# Patient Record
Sex: Male | Born: 1965 | Race: Black or African American | Hispanic: No | Marital: Married | Smoking: Never smoker
Health system: Southern US, Community
[De-identification: ages and names within clinical notes are randomized; demographics above are authoritative.]

## PROBLEM LIST (undated history)

## (undated) DIAGNOSIS — K760 Fatty (change of) liver, not elsewhere classified: Secondary | ICD-10-CM

## (undated) DIAGNOSIS — E669 Obesity, unspecified: Secondary | ICD-10-CM

## (undated) DIAGNOSIS — J039 Acute tonsillitis, unspecified: Secondary | ICD-10-CM

## (undated) DIAGNOSIS — I10 Essential (primary) hypertension: Secondary | ICD-10-CM

## (undated) DIAGNOSIS — Z7982 Long term (current) use of aspirin: Secondary | ICD-10-CM

## (undated) DIAGNOSIS — R079 Chest pain, unspecified: Secondary | ICD-10-CM

## (undated) DIAGNOSIS — I2510 Atherosclerotic heart disease of native coronary artery without angina pectoris: Secondary | ICD-10-CM

## (undated) DIAGNOSIS — R001 Bradycardia, unspecified: Secondary | ICD-10-CM

## (undated) DIAGNOSIS — G894 Chronic pain syndrome: Secondary | ICD-10-CM

## (undated) DIAGNOSIS — G4733 Obstructive sleep apnea (adult) (pediatric): Secondary | ICD-10-CM

## (undated) DIAGNOSIS — R42 Dizziness and giddiness: Secondary | ICD-10-CM

## (undated) DIAGNOSIS — M5412 Radiculopathy, cervical region: Secondary | ICD-10-CM

## (undated) DIAGNOSIS — R7303 Prediabetes: Secondary | ICD-10-CM

## (undated) DIAGNOSIS — E7800 Pure hypercholesterolemia, unspecified: Secondary | ICD-10-CM

## (undated) DIAGNOSIS — G5601 Carpal tunnel syndrome, right upper limb: Secondary | ICD-10-CM

## (undated) DIAGNOSIS — M5030 Other cervical disc degeneration, unspecified cervical region: Secondary | ICD-10-CM

## (undated) DIAGNOSIS — N62 Hypertrophy of breast: Secondary | ICD-10-CM

## (undated) DIAGNOSIS — K219 Gastro-esophageal reflux disease without esophagitis: Secondary | ICD-10-CM

## (undated) HISTORY — DX: Gastro-esophageal reflux disease without esophagitis: K21.9

## (undated) HISTORY — PX: CERVICAL SPINE SURGERY: SHX589

## (undated) HISTORY — DX: Pure hypercholesterolemia, unspecified: E78.00

## (undated) HISTORY — PX: ELBOW SURGERY: SHX618

## (undated) HISTORY — DX: Fatty (change of) liver, not elsewhere classified: K76.0

## (undated) HISTORY — PX: COLONOSCOPY: SHX174

## (undated) HISTORY — DX: Acute tonsillitis, unspecified: J03.90

---

## 2010-10-08 ENCOUNTER — Emergency Department: Payer: Self-pay | Admitting: Emergency Medicine

## 2011-09-16 ENCOUNTER — Emergency Department: Payer: Self-pay | Admitting: Emergency Medicine

## 2011-10-01 DEATH — deceased

## 2011-12-09 ENCOUNTER — Ambulatory Visit: Payer: Self-pay | Admitting: Physician Assistant

## 2012-10-19 ENCOUNTER — Ambulatory Visit: Payer: Self-pay | Admitting: Neurosurgery

## 2013-01-27 ENCOUNTER — Ambulatory Visit: Payer: Self-pay | Admitting: Anesthesiology

## 2013-02-02 ENCOUNTER — Encounter: Payer: Self-pay | Admitting: Neurology

## 2013-02-28 ENCOUNTER — Encounter: Payer: Self-pay | Admitting: Neurology

## 2013-03-24 ENCOUNTER — Ambulatory Visit: Payer: Self-pay | Admitting: Anesthesiology

## 2013-03-30 ENCOUNTER — Encounter: Payer: Self-pay | Admitting: Neurology

## 2013-04-30 ENCOUNTER — Encounter: Payer: Self-pay | Admitting: Neurology

## 2013-05-06 ENCOUNTER — Ambulatory Visit: Payer: Self-pay | Admitting: Neurology

## 2013-05-10 ENCOUNTER — Ambulatory Visit: Payer: Self-pay | Admitting: Anesthesiology

## 2014-03-03 DIAGNOSIS — G8929 Other chronic pain: Secondary | ICD-10-CM | POA: Insufficient documentation

## 2014-03-03 DIAGNOSIS — M542 Cervicalgia: Secondary | ICD-10-CM

## 2014-04-06 ENCOUNTER — Ambulatory Visit: Payer: Self-pay | Admitting: Anesthesiology

## 2014-10-27 ENCOUNTER — Ambulatory Visit: Payer: Self-pay | Admitting: Anesthesiology
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
## 2015-04-26 ENCOUNTER — Other Ambulatory Visit: Payer: Self-pay | Admitting: Anesthesiology

## 2015-05-01 ENCOUNTER — Telehealth: Payer: Self-pay | Admitting: Anesthesiology

## 2015-05-01 NOTE — Telephone Encounter (Signed)
patient has not been seen since Jan 2016. Has appt Wednesday 05-03-2015 for refill.

## 2015-05-01 NOTE — Telephone Encounter (Signed)
Needs to get a script for tramadol

## 2015-05-03 ENCOUNTER — Encounter: Payer: Self-pay | Admitting: Anesthesiology

## 2015-05-04 ENCOUNTER — Encounter: Payer: Self-pay | Admitting: Anesthesiology

## 2015-05-04 ENCOUNTER — Ambulatory Visit: Payer: Federal, State, Local not specified - PPO | Attending: Anesthesiology | Admitting: Anesthesiology

## 2015-05-04 VITALS — BP 134/78 | HR 77 | Temp 98.6°F | Resp 18 | Ht 68.0 in | Wt 225.0 lb

## 2015-05-04 DIAGNOSIS — G8929 Other chronic pain: Secondary | ICD-10-CM | POA: Insufficient documentation

## 2015-05-04 DIAGNOSIS — M47812 Spondylosis without myelopathy or radiculopathy, cervical region: Secondary | ICD-10-CM

## 2015-05-04 DIAGNOSIS — M542 Cervicalgia: Secondary | ICD-10-CM | POA: Insufficient documentation

## 2015-05-04 DIAGNOSIS — M5136 Other intervertebral disc degeneration, lumbar region: Secondary | ICD-10-CM

## 2015-05-04 MED ORDER — TRAMADOL HCL 50 MG PO TABS: 50.0000 mg | ORAL_TABLET | Freq: Four times a day (QID) | ORAL | Status: DC | PRN

## 2015-05-04 NOTE — Progress Notes (Signed)
Safety precautions to be maintained throughout the outpatient stay will include: orient to surroundings, keep bed in low position, maintain call bell within reach at all times, provide assistance with transfer out of bed and ambulation.  

## 2015-05-04 NOTE — Progress Notes (Signed)
Discharged to home, ambulatory with script for tramadol in hand.  Return in 6 months.

## 2015-05-09 NOTE — Progress Notes (Addendum)
Chief complaint is cervicalgia    Procedure: none  History of present illness: Carlos Gentry continues to do reasonably well with the current medication regimen. The pain continues to stay reasonably well controlled with no  significant changes noted in baseline symptom complex. No change upper  extremity strength or function or bowel bladder function. Based on the  narcotic assessment sheet, the  patient continues to do well with this current regimen with no evidence of diverting or illicit use and better overall functioning noted. He continues to take his flexeril prn, nubametone and ultram, generally two in the am and sometimes a third in the am.  I instructed him to hold on the third until 6 hours the intial am dosing.  BP 134/78 mmHg  Pulse 77  Temp(Src) 98.6 F (37 C) (Oral)  Resp 18  Ht  (1.727 m)  Wt 225 lb (102.059 kg)  BMI 34.22 kg/m2  SpO2 100%   Current outpatient prescriptions:  .  cyclobenzaprine (FLEXERIL) 10 MG tablet, Take 10 mg by mouth. prn, Disp: , Rfl:  .  fluticasone (FLONASE) 50 MCG/ACT nasal spray, Place into both nostrils daily. prn, Disp: , Rfl:  .  ibuprofen (ADVIL,MOTRIN) 200 MG tablet, Take 200 mg by mouth. 2 tabs as needed, Disp: , Rfl:  .  nabumetone (RELAFEN) 500 MG tablet, Take 500 mg by mouth 2 (two) times daily., Disp: , Rfl:  .  omeprazole (PRILOSEC) 40 MG capsule, Take 40 mg by mouth 3 (three) times daily., Disp: , Rfl:  .  pravastatin (PRAVACHOL) 10 MG tablet, Take 10 mg by mouth daily., Disp: , Rfl:  .  traMADol (ULTRAM) 50 MG tablet, Take 1 tablet (50 mg total) by mouth every 6 (six) hours as needed for moderate pain., Disp: 120 tablet, Rfl: 5  There are no active problems to display for this patient.   No Known Allergies  Physical exam:   pupils are equally round and reactive to light  Extraocular muscles are intact   Heart is regular rate and rhythm  Upper extremity strength and function remains at baseline with no significant  changes noted on examination today.  Assessment:  #1 chronic neck pain, cervicalgia status post acdf   #2 chronic pain management  Plan:   We'll refill medications at present with return to clinic in the next several months for reevaluation. Patient is to continue with physical therapy exercises and aerobic conditioning as tolerated and continue follow-up with their primary care physician for baseline medical problems. He is to call if his sx change or worsen Dr. Gwenyth Bender 7:21 PM

## 2015-12-20 ENCOUNTER — Other Ambulatory Visit: Payer: Self-pay | Admitting: Physician Assistant

## 2015-12-20 DIAGNOSIS — R1084 Generalized abdominal pain: Secondary | ICD-10-CM

## 2015-12-21 ENCOUNTER — Ambulatory Visit (HOSPITAL_BASED_OUTPATIENT_CLINIC_OR_DEPARTMENT_OTHER): Payer: Federal, State, Local not specified - PPO | Admitting: Anesthesiology

## 2015-12-21 ENCOUNTER — Encounter: Payer: Self-pay | Admitting: Anesthesiology

## 2015-12-21 VITALS — BP 130/76 | HR 71 | Temp 98.1°F | Resp 16 | Ht 68.0 in | Wt 230.0 lb

## 2015-12-21 DIAGNOSIS — M4692 Unspecified inflammatory spondylopathy, cervical region: Secondary | ICD-10-CM | POA: Diagnosis not present

## 2015-12-21 DIAGNOSIS — R1084 Generalized abdominal pain: Secondary | ICD-10-CM | POA: Diagnosis present

## 2015-12-21 DIAGNOSIS — M542 Cervicalgia: Secondary | ICD-10-CM

## 2015-12-21 DIAGNOSIS — M5136 Other intervertebral disc degeneration, lumbar region: Secondary | ICD-10-CM

## 2015-12-21 DIAGNOSIS — N281 Cyst of kidney, acquired: Secondary | ICD-10-CM | POA: Diagnosis not present

## 2015-12-21 DIAGNOSIS — Z981 Arthrodesis status: Secondary | ICD-10-CM | POA: Diagnosis not present

## 2015-12-21 DIAGNOSIS — Z9889 Other specified postprocedural states: Secondary | ICD-10-CM | POA: Diagnosis not present

## 2015-12-21 DIAGNOSIS — G8929 Other chronic pain: Secondary | ICD-10-CM | POA: Diagnosis not present

## 2015-12-21 DIAGNOSIS — Z7951 Long term (current) use of inhaled steroids: Secondary | ICD-10-CM | POA: Diagnosis not present

## 2015-12-21 DIAGNOSIS — M47812 Spondylosis without myelopathy or radiculopathy, cervical region: Secondary | ICD-10-CM

## 2015-12-21 DIAGNOSIS — N1330 Unspecified hydronephrosis: Secondary | ICD-10-CM | POA: Diagnosis not present

## 2015-12-21 DIAGNOSIS — K760 Fatty (change of) liver, not elsewhere classified: Secondary | ICD-10-CM | POA: Diagnosis not present

## 2015-12-21 MED ORDER — TRAMADOL HCL 50 MG PO TABS: 50.0000 mg | ORAL_TABLET | Freq: Four times a day (QID) | ORAL | Status: DC | PRN

## 2015-12-21 MED ORDER — NABUMETONE 500 MG PO TABS: 500.0000 mg | ORAL_TABLET | Freq: Two times a day (BID) | ORAL | Status: DC

## 2015-12-21 MED ORDER — CYCLOBENZAPRINE HCL 10 MG PO TABS: 10.0000 mg | ORAL_TABLET | Freq: Once | ORAL | Status: DC

## 2015-12-21 NOTE — Patient Instructions (Signed)
Prescriptions for Relafen and Flexeril were sent to your pharmacy. You were given a prescription for Tramadol today.

## 2015-12-21 NOTE — Progress Notes (Signed)
Safety precautions to be maintained throughout the outpatient stay will include: orient to surroundings, keep bed in low position, maintain call bell within reach at all times, provide assistance with transfer out of bed and ambulation.  

## 2015-12-22 ENCOUNTER — Ambulatory Visit
Admission: RE | Admit: 2015-12-22 | Discharge: 2015-12-22 | Disposition: A | Discharge status: Home / Self Care | Payer: Federal, State, Local not specified - PPO | Source: Ambulatory Visit | Attending: Physician Assistant | Admitting: Physician Assistant

## 2015-12-22 DIAGNOSIS — Z981 Arthrodesis status: Secondary | ICD-10-CM | POA: Insufficient documentation

## 2015-12-22 DIAGNOSIS — N1330 Unspecified hydronephrosis: Secondary | ICD-10-CM | POA: Insufficient documentation

## 2015-12-22 DIAGNOSIS — G8929 Other chronic pain: Secondary | ICD-10-CM | POA: Insufficient documentation

## 2015-12-22 DIAGNOSIS — K760 Fatty (change of) liver, not elsewhere classified: Secondary | ICD-10-CM | POA: Insufficient documentation

## 2015-12-22 DIAGNOSIS — N281 Cyst of kidney, acquired: Secondary | ICD-10-CM | POA: Insufficient documentation

## 2015-12-22 DIAGNOSIS — R1084 Generalized abdominal pain: Secondary | ICD-10-CM | POA: Diagnosis not present

## 2015-12-22 DIAGNOSIS — Z7951 Long term (current) use of inhaled steroids: Secondary | ICD-10-CM | POA: Insufficient documentation

## 2015-12-22 DIAGNOSIS — Z9889 Other specified postprocedural states: Secondary | ICD-10-CM | POA: Insufficient documentation

## 2015-12-22 DIAGNOSIS — M542 Cervicalgia: Secondary | ICD-10-CM | POA: Insufficient documentation

## 2015-12-22 NOTE — Progress Notes (Signed)
Chief complaint is cervicalgia    Procedure: none  History of present illness: Carlos Gentry was last seen approximately 6 months ago. We see him intermittently for his neck pain following surgery. He's been taking his medications in the usual fashion as prescribed and using Flexeril on a when necessary basis.  He takes Ultram approximately twice a day for breakthrough pain and uses Relafen when necessary as well. Otherwise his neck pain and upper extremity strength and function are at baseline and based on his narcotic assessment sheet seems to be doing well with his current regimen.  BP 130/76 mmHg  Pulse 71  Temp(Src) 98.1 F (36.7 C) (Oral)  Resp 16  Ht 5\' 8"  (1.727 m)  Wt 230 lb (104.327 kg)  BMI 34.98 kg/m2  SpO2 98%   Current outpatient prescriptions:  .  cyclobenzaprine (FLEXERIL) 10 MG tablet, Take 1 tablet (10 mg total) by mouth once. prn, Disp: 30 tablet, Rfl: 5 .  fluticasone (FLONASE) 50 MCG/ACT nasal spray, Place into both nostrils daily. prn, Disp: , Rfl:  .  ibuprofen (ADVIL,MOTRIN) 200 MG tablet, Take 200 mg by mouth. 2 tabs as needed, Disp: , Rfl:  .  meloxicam (MOBIC) 15 MG tablet, Take 15 mg by mouth daily., Disp: , Rfl:  .  nabumetone (RELAFEN) 500 MG tablet, Take 1 tablet (500 mg total) by mouth 2 (two) times daily., Disp: 60 tablet, Rfl: 5 .  omeprazole (PRILOSEC) 40 MG capsule, Take 40 mg by mouth 3 (three) times daily., Disp: , Rfl:  .  pravastatin (PRAVACHOL) 10 MG tablet, Take 10 mg by mouth daily., Disp: , Rfl:  .  traMADol (ULTRAM) 50 MG tablet, Take 1 tablet (50 mg total) by mouth every 6 (six) hours as needed for moderate pain., Disp: 120 tablet, Rfl: 5  There are no active problems to display for this patient.   No Known Allergies  Physical exam:   pupils are equally round and reactive to light  Extraocular muscles are intact   Heart is regular rate and rhythm  Upper extremity strength and function remains at baseline with no significant changes  noted on examination today.  Assessment:  #1 chronic neck pain, cervicalgia status post acdf   #2 chronic pain management  Plan:   We'll refill medications at present with return to clinic in the next several months for reevaluation. Patient is to continue with physical therapy exercises and aerobic conditioning as tolerated and continue follow-up with their primary care physician for baseline medical problems. He is to call if his sx change or worsen Dr. Gwenyth BenderJames Sonu Kruckenberg 10:34 AM

## 2016-01-04 DIAGNOSIS — R7303 Prediabetes: Secondary | ICD-10-CM | POA: Insufficient documentation

## 2016-01-04 DIAGNOSIS — Z7189 Other specified counseling: Secondary | ICD-10-CM | POA: Insufficient documentation

## 2016-01-04 DIAGNOSIS — Z7185 Encounter for immunization safety counseling: Secondary | ICD-10-CM | POA: Insufficient documentation

## 2016-01-17 ENCOUNTER — Other Ambulatory Visit: Payer: Self-pay | Admitting: Physician Assistant

## 2016-01-17 DIAGNOSIS — K760 Fatty (change of) liver, not elsewhere classified: Secondary | ICD-10-CM

## 2016-01-23 ENCOUNTER — Ambulatory Visit: Payer: Federal, State, Local not specified - PPO

## 2016-01-25 ENCOUNTER — Ambulatory Visit
Admission: RE | Admit: 2016-01-25 | Discharge: 2016-01-25 | Disposition: A | Discharge status: Home / Self Care | Payer: Federal, State, Local not specified - PPO | Source: Ambulatory Visit | Attending: Physician Assistant | Admitting: Physician Assistant

## 2016-01-25 ENCOUNTER — Ambulatory Visit: Payer: Federal, State, Local not specified - PPO

## 2016-01-25 DIAGNOSIS — K760 Fatty (change of) liver, not elsewhere classified: Secondary | ICD-10-CM | POA: Diagnosis not present

## 2016-02-29 ENCOUNTER — Ambulatory Visit: Payer: Federal, State, Local not specified - PPO | Admitting: Dietician

## 2016-03-21 ENCOUNTER — Encounter: Payer: Self-pay | Admitting: Dietician

## 2016-03-21 ENCOUNTER — Encounter: Payer: Federal, State, Local not specified - PPO | Attending: Physician Assistant | Admitting: Dietician

## 2016-03-21 VITALS — Ht 68.0 in | Wt 224.8 lb

## 2016-03-21 DIAGNOSIS — K760 Fatty (change of) liver, not elsewhere classified: Secondary | ICD-10-CM | POA: Diagnosis not present

## 2016-03-21 DIAGNOSIS — E669 Obesity, unspecified: Secondary | ICD-10-CM

## 2016-03-21 DIAGNOSIS — E7800 Pure hypercholesterolemia, unspecified: Secondary | ICD-10-CM | POA: Insufficient documentation

## 2016-03-21 DIAGNOSIS — I10 Essential (primary) hypertension: Secondary | ICD-10-CM | POA: Insufficient documentation
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
## 2016-03-21 NOTE — Patient Instructions (Addendum)
   Control portions of meats and starchy foods, add more vegetables.   Make veggies convenient, try pre-cut, washed veggies, keep them in sight in the fridge.   Also control portions of sweets like cookies, try graham crackers or vanilla wafers with a small amount of peanut butter, or nutella for chocolate taste. Or eat a fruit followed by a smaller portion of cookies.   Try tracking food intake and exercise using a free app like MyFitnessPal or LoseIt.   Walk more during shift at work, maybe with a step counter.

## 2016-05-06 ENCOUNTER — Ambulatory Visit: Payer: Federal, State, Local not specified - PPO | Admitting: Dietician

## 2016-05-23 ENCOUNTER — Encounter: Payer: Self-pay | Admitting: Dietician

## 2016-05-23 NOTE — Progress Notes (Signed)
Have not heard from patient after missing two consecutive appointments for RD follow-up. Sent discharge letter to referring PA.

## 2016-08-28 ENCOUNTER — Ambulatory Visit: Payer: Federal, State, Local not specified - PPO | Attending: Anesthesiology | Admitting: Anesthesiology

## 2016-08-28 VITALS — BP 131/82 | HR 78 | Temp 98.2°F | Resp 14 | Ht 68.0 in | Wt 228.0 lb

## 2016-08-28 DIAGNOSIS — Z981 Arthrodesis status: Secondary | ICD-10-CM | POA: Insufficient documentation

## 2016-08-28 DIAGNOSIS — M542 Cervicalgia: Secondary | ICD-10-CM | POA: Insufficient documentation

## 2016-08-28 DIAGNOSIS — R200 Anesthesia of skin: Secondary | ICD-10-CM | POA: Insufficient documentation

## 2016-08-28 DIAGNOSIS — M4692 Unspecified inflammatory spondylopathy, cervical region: Secondary | ICD-10-CM | POA: Diagnosis not present

## 2016-08-28 DIAGNOSIS — Z5189 Encounter for other specified aftercare: Secondary | ICD-10-CM | POA: Diagnosis present

## 2016-08-28 DIAGNOSIS — M5136 Other intervertebral disc degeneration, lumbar region: Secondary | ICD-10-CM

## 2016-08-28 DIAGNOSIS — I10 Essential (primary) hypertension: Secondary | ICD-10-CM | POA: Insufficient documentation

## 2016-08-28 DIAGNOSIS — M47812 Spondylosis without myelopathy or radiculopathy, cervical region: Secondary | ICD-10-CM

## 2016-08-28 DIAGNOSIS — G8929 Other chronic pain: Secondary | ICD-10-CM | POA: Diagnosis not present

## 2016-08-28 MED ORDER — TRAMADOL HCL 50 MG PO TABS: 50.0000 mg | ORAL_TABLET | Freq: Four times a day (QID) | ORAL | 2 refills | Status: DC | PRN

## 2016-08-28 NOTE — Progress Notes (Signed)
Safety precautions to be maintained throughout the outpatient stay will include: orient to surroundings, keep bed in low position, maintain call bell within reach at all times, provide assistance with transfer out of bed and ambulation.  

## 2016-08-29 ENCOUNTER — Other Ambulatory Visit: Payer: Self-pay

## 2016-09-02 NOTE — Progress Notes (Signed)
Chief complaint is cervicalgia    Procedure: none   History of present illness: Carlos Gentry presents for reevaluation. He has been doing well since his last visit several months ago. He has had some intermittent neck pain with some numbness and tingling in the fingers on the left hand. He describes this to be in the fourth and fifth digits and is intermittent in nature and worse with certain increase in activity but no weakness is noted he is doing exercises with stretching and strengthening as prescribed and otherwise appears to be doing well. No interval changes are noted.  Review of systems Cardiac: No angina or chest pain GI: No constipation  BP 131/82   Pulse 78   Temp 98.2 F (36.8 C) (Oral)   Resp 14   Ht 5\' 8"  (1.727 m)   Wt 228 lb (103.4 kg)   SpO2 100%   BMI 34.67 kg/m    Current Outpatient Prescriptions:  .  cyclobenzaprine (FLEXERIL) 10 MG tablet, Take 1 tablet (10 mg total) by mouth once. prn, Disp: 30 tablet, Rfl: 5 .  fluticasone (FLONASE) 50 MCG/ACT nasal spray, Place into both nostrils daily. prn, Disp: , Rfl:  .  ibuprofen (ADVIL,MOTRIN) 200 MG tablet, Take 200 mg by mouth. 2 tabs as needed, Disp: , Rfl:  .  meloxicam (MOBIC) 15 MG tablet, Take 15 mg by mouth daily., Disp: , Rfl:  .  nabumetone (RELAFEN) 500 MG tablet, Take 1 tablet (500 mg total) by mouth 2 (two) times daily., Disp: 60 tablet, Rfl: 5 .  omeprazole (PRILOSEC) 40 MG capsule, Take 40 mg by mouth 3 (three) times daily., Disp: , Rfl:  .  pravastatin (PRAVACHOL) 10 MG tablet, Take 10 mg by mouth daily., Disp: , Rfl:  .  traMADol (ULTRAM) 50 MG tablet, Take 1 tablet (50 mg total) by mouth every 6 (six) hours as needed for moderate pain., Disp: 120 tablet, Rfl: 2  Patient Active Problem List   Diagnosis Date Noted  . Benign essential HTN 03/21/2016  . Adiposity 03/21/2016  . Pure hypercholesterolemia 03/21/2016  . Borderline diabetes mellitus 01/04/2016  . Other specified counseling 01/04/2016  .  Chronic cervical pain 03/03/2014    No Known Allergies  Physical exam:   pupils are equally round and reactive to light  Extraocular muscles are intact   Heart is regular rate and rhythm  His upper extremity muscle tone and bulk is excellent without deficit noted. No numbness or tingling are noted in the left upper extremity.  Assessment:  #1 chronic neck pain, cervicalgia status post acdf   #2 chronic pain management.  Plan:  We will have him continue taking his current medication regimen. He can continue taking anti-inflammatories as prescribed in addition to his Ultram. He is to continue doing his upper extremity exercises as reviewed with him in detail today.   Dr. Gwenyth BenderJames Adams 9:49 AM

## 2016-09-04 LAB — TOXASSURE SELECT 13 (MW), URINE

## 2017-01-14 ENCOUNTER — Ambulatory Visit: Payer: Federal, State, Local not specified - PPO | Admitting: Anesthesiology

## 2017-01-27 ENCOUNTER — Encounter: Payer: Self-pay | Admitting: Anesthesiology

## 2017-01-27 ENCOUNTER — Ambulatory Visit: Payer: Federal, State, Local not specified - PPO | Attending: Anesthesiology | Admitting: Anesthesiology

## 2017-01-27 VITALS — BP 135/84 | HR 88 | Temp 97.6°F | Resp 16 | Ht 68.0 in | Wt 235.0 lb

## 2017-01-27 DIAGNOSIS — E669 Obesity, unspecified: Secondary | ICD-10-CM | POA: Insufficient documentation

## 2017-01-27 DIAGNOSIS — M542 Cervicalgia: Secondary | ICD-10-CM | POA: Insufficient documentation

## 2017-01-27 DIAGNOSIS — M47812 Spondylosis without myelopathy or radiculopathy, cervical region: Secondary | ICD-10-CM

## 2017-01-27 DIAGNOSIS — R7303 Prediabetes: Secondary | ICD-10-CM | POA: Insufficient documentation

## 2017-01-27 DIAGNOSIS — M5136 Other intervertebral disc degeneration, lumbar region: Secondary | ICD-10-CM | POA: Diagnosis not present

## 2017-01-27 DIAGNOSIS — Z981 Arthrodesis status: Secondary | ICD-10-CM | POA: Diagnosis not present

## 2017-01-27 DIAGNOSIS — M4692 Unspecified inflammatory spondylopathy, cervical region: Secondary | ICD-10-CM | POA: Diagnosis not present

## 2017-01-27 DIAGNOSIS — I10 Essential (primary) hypertension: Secondary | ICD-10-CM | POA: Diagnosis not present

## 2017-01-27 DIAGNOSIS — R200 Anesthesia of skin: Secondary | ICD-10-CM | POA: Diagnosis not present

## 2017-01-27 DIAGNOSIS — E7800 Pure hypercholesterolemia, unspecified: Secondary | ICD-10-CM | POA: Diagnosis not present

## 2017-01-27 DIAGNOSIS — G8929 Other chronic pain: Secondary | ICD-10-CM | POA: Insufficient documentation

## 2017-01-27 MED ORDER — TRAMADOL HCL 50 MG PO TABS: 50.0000 mg | ORAL_TABLET | Freq: Four times a day (QID) | ORAL | 2 refills | Status: DC | PRN

## 2017-01-27 NOTE — Progress Notes (Signed)
Nursing Pain Medication Assessment:  Safety precautions to be maintained throughout the outpatient stay will include: orient to surroundings, keep bed in low position, maintain call bell within reach at all times, provide assistance with transfer out of bed and ambulation.  Medication Inspection Compliance: Carlos Gentry did not comply with our request to bring his pills to be counted. He was reminded that bringing the medication bottles, even when empty, is a requirement.  Medication: None brought in. Pill/Patch Count: None available to be counted. Bottle Appearance: No container available. Did not bring bottle(s) to appointment. Filled Date: N/A Last Medication intake:  Today 

## 2017-01-27 NOTE — Progress Notes (Signed)
Chief complaint is cervicalgia    Procedure: none   History of present illness: Carlos Gentry presents for reevaluation last seen several months ago. He states that he's been doing relatively well since then. He still using his Ultram for breakthrough pain but he is primarily having centralized neck pain worse with certain activities such as mowing his lawn. The numbness and tingling he was experiencing in the left hand has improved. He uses his Ultram only when he is having a pain crisis they generally occurs after heavy exercise. No other changes are noted at this time and his upper extremity strength is been doing well.  Review of systems Cardiac: No angina or chest pain GI: No constipation otherwise no change  BP 135/84   Pulse 88   Temp 97.6 F (36.4 C)   Resp 16   Ht  (1.727 m)   Wt 235 lb (106.6 kg)   SpO2 99%   BMI 35.73 kg/m    Current Outpatient Prescriptions:  .  cyclobenzaprine (FLEXERIL) 10 MG tablet, Take 1 tablet (10 mg total) by mouth once. prn, Disp: 30 tablet, Rfl: 5 .  fluticasone (FLONASE) 50 MCG/ACT nasal spray, Place into both nostrils daily. prn, Disp: , Rfl:  .  ibuprofen (ADVIL,MOTRIN) 200 MG tablet, Take 200 mg by mouth. 2 tabs as needed, Disp: , Rfl:  .  meloxicam (MOBIC) 15 MG tablet, Take 15 mg by mouth daily., Disp: , Rfl:  .  omeprazole (PRILOSEC) 40 MG capsule, Take 40 mg by mouth 3 (three) times daily., Disp: , Rfl:  .  pravastatin (PRAVACHOL) 10 MG tablet, Take 10 mg by mouth daily., Disp: , Rfl:  .  traMADol (ULTRAM) 50 MG tablet, Take 1 tablet (50 mg total) by mouth every 6 (six) hours as needed for moderate pain., Disp: 120 tablet, Rfl: 2 .  nabumetone (RELAFEN) 500 MG tablet, Take 1 tablet (500 mg total) by mouth 2 (two) times daily. (Patient not taking: Reported on 01/27/2017), Disp: 60 tablet, Rfl: 5  Patient Active Problem List   Diagnosis Date Noted  . Benign essential HTN 03/21/2016  . Adiposity 03/21/2016  . Pure hypercholesterolemia  03/21/2016  . Borderline diabetes mellitus 01/04/2016  . Other specified counseling 01/04/2016  . Chronic cervical pain 03/03/2014    No Known Allergies  Physical exam:   pupils are equally round and reactive to light  Extraocular muscles are intact   Heart is regular rate and rhythm  His upper extremity muscle tone and bulk is excellent without deficit noted. No numbness or tingling are noted in the left upper extremity. He has some mild centralized posterior neck tenderness but no overt trigger points with some mild pain on prolonged extension at the AO joint.  Assessment:  #1 chronic neck pain, cervicalgia status post acdf   #2 chronic pain management.  Plan:  We will have him continue taking his current medication regimen. He can continue taking anti-inflammatories as prescribed in addition to his Ultram. He is to continue doing his upper extremity exercises as reviewed with him in detail today. A refill for his Ultram was given today.  Dr. Gwenyth Bender 4:04 PM

## 2017-05-15 ENCOUNTER — Other Ambulatory Visit: Payer: Self-pay | Admitting: Anesthesiology

## 2017-05-27 ENCOUNTER — Ambulatory Visit: Payer: Federal, State, Local not specified - PPO | Attending: Nurse Practitioner | Admitting: Nurse Practitioner

## 2017-05-27 ENCOUNTER — Encounter: Payer: Self-pay | Admitting: Nurse Practitioner

## 2017-05-27 VITALS — BP 135/85 | HR 81 | Temp 98.6°F | Resp 16 | Ht 68.0 in | Wt 230.0 lb

## 2017-05-27 DIAGNOSIS — M542 Cervicalgia: Secondary | ICD-10-CM | POA: Diagnosis not present

## 2017-05-27 DIAGNOSIS — K219 Gastro-esophageal reflux disease without esophagitis: Secondary | ICD-10-CM | POA: Diagnosis not present

## 2017-05-27 DIAGNOSIS — E669 Obesity, unspecified: Secondary | ICD-10-CM | POA: Insufficient documentation

## 2017-05-27 DIAGNOSIS — K760 Fatty (change of) liver, not elsewhere classified: Secondary | ICD-10-CM | POA: Diagnosis not present

## 2017-05-27 DIAGNOSIS — G894 Chronic pain syndrome: Secondary | ICD-10-CM

## 2017-05-27 DIAGNOSIS — M5030 Other cervical disc degeneration, unspecified cervical region: Secondary | ICD-10-CM | POA: Diagnosis not present

## 2017-05-27 DIAGNOSIS — E7800 Pure hypercholesterolemia, unspecified: Secondary | ICD-10-CM | POA: Insufficient documentation

## 2017-05-27 DIAGNOSIS — I10 Essential (primary) hypertension: Secondary | ICD-10-CM | POA: Diagnosis not present

## 2017-05-27 DIAGNOSIS — R7303 Prediabetes: Secondary | ICD-10-CM | POA: Diagnosis not present

## 2017-05-27 DIAGNOSIS — Z79891 Long term (current) use of opiate analgesic: Secondary | ICD-10-CM

## 2017-05-27 DIAGNOSIS — Z6834 Body mass index (BMI) 34.0-34.9, adult: Secondary | ICD-10-CM | POA: Insufficient documentation

## 2017-05-27 DIAGNOSIS — G8929 Other chronic pain: Secondary | ICD-10-CM

## 2017-05-27 MED ORDER — TRAMADOL HCL 50 MG PO TABS: ORAL_TABLET | ORAL | 2 refills | Status: DC

## 2017-05-27 NOTE — Progress Notes (Signed)
Nursing Pain Medication Assessment:  Safety precautions to be maintained throughout the outpatient stay will include: orient to surroundings, keep bed in low position, maintain call bell within reach at all times, provide assistance with transfer out of bed and ambulation.  Medication Inspection Compliance: Pill count conducted under aseptic conditions, in front of the patient. Neither the pills nor the bottle was removed from the patient's sight at any time. Once count was completed pills were immediately returned to the patient in their original bottle.  Medication: See above Pill/Patch Count: 16 of 120 pills remain Pill/Patch Appearance: Markings consistent with prescribed medication Bottle Appearance: Standard pharmacy container. Clearly labeled. Filled Date: 07 / 10 / 2018 Last Medication intake:  Today

## 2017-05-27 NOTE — Patient Instructions (Addendum)
____________________________________________________________________________________________  Medication Rules  Applies to: All patients receiving prescriptions (written or electronic).  Pharmacy of record: Pharmacy where electronic prescriptions will be sent. If written prescriptions are taken to a different pharmacy, please inform the nursing staff. The pharmacy listed in the electronic medical record should be the one where you would like electronic prescriptions to be sent.  Prescription refills: Only during scheduled appointments. Applies to both, written and electronic prescriptions.  NOTE: The following applies primarily to controlled substances (Opioid* Pain Medications).   Patient's responsibilities: 1. Pain Pills: Bring all pain pills to every appointment (except for procedure appointments). 2. Pill Bottles: Bring pills in original pharmacy bottle. Always bring newest bottle. Bring bottle, even if empty. 3. Medication refills: You are responsible for knowing and keeping track of what medications you need refilled. The day before your appointment, write a list of all prescriptions that need to be refilled. Bring that list to your appointment and give it to the admitting nurse. Prescriptions will be written only during appointments. If you forget a medication, it will not be "Called in", "Faxed", or "electronically sent". You will need to get another appointment to get these prescribed. 4. Prescription Accuracy: You are responsible for carefully inspecting your prescriptions before leaving our office. Have the discharge nurse carefully go over each prescription with you, before taking them home. Make sure that your name is accurately spelled, that your address is correct. Check the name and dose of your medication to make sure it is accurate. Check the number of pills, and the written instructions to make sure they are clear and accurate. Make sure that you are given enough medication to  last until your next medication refill appointment. 5. Taking Medication: Take medication as prescribed. Never take more pills than instructed. Never take medication more frequently than prescribed. Taking less pills or less frequently is permitted and encouraged, when it comes to controlled substances (written prescriptions).  6. Inform other Doctors: Always inform, all of your healthcare providers, of all the medications you take. 7. Pain Medication from other Providers: You are not allowed to accept any additional pain medication from any other Doctor or Healthcare provider. There are two exceptions to this rule. (see below) In the event that you require additional pain medication, you are responsible for notifying us, as stated below. 8. Medication Agreement: You are responsible for carefully reading and following our Medication Agreement. This must be signed before receiving any prescriptions from our practice. Safely store a copy of your signed Agreement. Violations to the Agreement will result in no further prescriptions. (Additional copies of our Medication Agreement are available upon request.) 9. Laws, Rules, & Regulations: All patients are expected to follow all Federal and State Laws, Statutes, Rules, & Regulations. Ignorance of the Laws does not constitute a valid excuse. The use of any illegal substances is prohibited. 10. Adopted CDC guidelines & recommendations: Target dosing levels will be at or below 60 MME/day. Use of benzodiazepines** is not recommended.  Exceptions: There are only two exceptions to the rule of not receiving pain medications from other Healthcare Providers. 1. Exception #1 (Emergencies): In the event of an emergency (i.e.: accident requiring emergency care), you are allowed to receive additional pain medication. However, you are responsible for: As soon as you are able, call our office [phone], at any time of the day or night, and leave a message stating your  name, the date and nature of the emergency, and the name and dose of the medication   prescribed. In the event that your call is answered by a member of our staff, make sure to document and save the date, time, and the name of the person that took your information.  2. Exception #2 (Planned Surgery): In the event that you are scheduled by another doctor or dentist to have any type of surgery or procedure, you are allowed (for a period no longer than 30 days), to receive additional pain medication, for the acute post-op pain. However, in this case, you are responsible for picking up a copy of our "Post-op Pain Management for Surgeons" handout, and giving it to your surgeon or dentist. This document is available at our office, and does not require an appointment to obtain it. Simply go to our office during business hours (Monday-Thursday from 8:00 AM to 4:00 PM) (Friday 8:00 AM to 12:00 Noon) or if you have a scheduled appointment with [place], prior to your surgery, and ask for it by name. In addition, you will need to provide [place] with your name, name of your surgeon, type of surgery, and date of procedure or surgery.  *Opioid medications include: morphine, codeine, oxycodone, oxymorphone, hydrocodone, hydromorphone, meperidine, tramadol, tapentadol, buprenorphine, fentanyl, methadone. **Benzodiazepine medications include: diazepam (Valium), alprazolam (Xanax), clonazepam (Klonopine), lorazepam (Ativan), clorazepate (Tranxene), chlordiazepoxide (Librium), estazolam (Prosom), oxazepam (Serax), temazepam (Restoril), triazolam (Halcion)  ____________________________________________________________________________________________  BMI interpretation table: BMI level Category Range association with higher incidence of chronic pain  <18 kg/m2 Underweight   18.5-24.9 kg/m2 Ideal body weight   25-29.9 kg/m2 Overweight Increased incidence by 20%  30-34.9 kg/m2 Obese (Class I) Increased incidence by 68%  35-39.9 kg/m2  Severe obesity (Class II) Increased incidence by 136%  >40 kg/m2 Extreme obesity (Class III) Increased incidence by 254%   BMI Readings from Last 4 Encounters:  05/27/17 34.97 kg/m  01/27/17 35.73 kg/m  08/28/16 34.67 kg/m  03/21/16 34.18 kg/m   Wt Readings from Last 4 Encounters:  05/27/17 230 lb (104.3 kg)  01/27/17 235 lb (106.6 kg)  08/28/16 228 lb (103.4 kg)  03/21/16 224 lb 12.8 oz (102 kg)

## 2017-05-27 NOTE — Progress Notes (Signed)
Patient's Name: Carlos Gentry  MRN: [id number]  Referring Provider: Dion Body, MD  DOB: 1966/05/31  PCP: Dion Body, MD  DOS: 05/27/2017  Note by: Vevelyn Francois NP  Service setting: Ambulatory outpatient  Specialty: Interventional Pain Management  Location: [facility] (AMB) Pain Management Facility    Patient type: Established    Primary Reason(s) for Visit: Encounter for prescription drug management. (Level of risk: moderate)  CC: Neck Pain (lower)  HPI  Carlos Gentry is a 51 y.o. year old, male patient, who comes today for a medication management evaluation. He has Borderline diabetes mellitus; Chronic neck pain; Benign essential hypertension; Obesity, unspecified; Pure hypercholesterolemia; Other specified counseling; Vaccine counseling; Long term current use of opiate analgesic; Chronic pain syndrome; and DDD (degenerative disc disease), cervical on his problem list. His primarily concern today is the Neck Pain (lower)  Pain Assessment: Location: Lower Neck Radiating: doesnt radiate Onset: More than a month ago Duration: Chronic pain Quality: Aching, Constant Severity: 5 /10 (self-reported pain score)  Note: Reported level is compatible with observation.                   Effect on ADL: pace Timing: Constant Modifying factors: medications, pace self  Carlos Gentry was last scheduled for an appointment on Visit date not found for medication management. During today's appointment we reviewed Carlos Gentry chronic pain status, as well as his outpatient medication regimen. He admits that he does not have any radicular symptoms. He admits that he is having a flare in his pain secondary to increased activity. He amdits that he has limitation with looking up for long periods.   The patient  has no drug history on file. His body mass index is 34.97 kg/m.  Further details on both, my assessment(s), as well as the proposed treatment plan, please see below.  Controlled Substance  Pharmacotherapy Assessment REMS (Risk Evaluation and Mitigation Strategy)  Analgesic: Tramadol 24m MME/day: 20 mg/day.  Carlos Specking RN  05/27/2017  9:19 AM  Sign at close encounter Nursing Pain Medication Assessment:  Safety precautions to be maintained throughout the outpatient stay will include: orient to surroundings, keep bed in low position, maintain call bell within reach at all times, provide assistance with transfer out of bed and ambulation.  Medication Inspection Compliance: Pill count conducted under aseptic conditions, in front of the patient. Neither the pills nor the bottle was removed from the patient's sight at any time. Once count was completed pills were immediately returned to the patient in their original bottle.  Medication: See above Pill/Patch Count: 16 of 120 pills remain Pill/Patch Appearance: Markings consistent with prescribed medication Bottle Appearance: Standard pharmacy container. Clearly labeled. Filled Date: 07 / 10 / 2018 Last Medication intake:  Today   Pharmacokinetics: Liberation and absorption (onset of action): WNL Distribution (time to peak effect): WNL Metabolism and excretion (duration of action): WNL         Pharmacodynamics: Desired effects: Analgesia: Carlos Gentry >50% benefit. Functional ability: Patient reports that medication allows him to accomplish basic ADLs Clinically meaningful improvement in function (CMIF): Sustained CMIF goals met Perceived effectiveness: Described as relatively effective, allowing for increase in activities of daily living (ADL) Undesirable effects: Side-effects or Adverse reactions: None reported Monitoring: [place] PMP: Online review of the past 164-montheriod conducted. Compliant with practice rules and regulations List of all UDS test(s) done:  Lab Results  Component Value Date   TOXASSSELUR FINAL 08/29/2016   Last UDS on record: [employer]  Select 13  Date Value Ref Range Status  08/29/2016  FINAL  Final    Comment:    ==================================================================== TOXASSURE SELECT 13 (MW) ==================================================================== Test                             Result       Flag       Units Drug Present and Declared for Prescription Verification   Tramadol                       PRESENT      EXPECTED   O-Desmethyltramadol            PRESENT      EXPECTED   N-Desmethyltramadol            PRESENT      EXPECTED    Source of tramadol is a prescription medication.    O-desmethyltramadol and N-desmethyltramadol are expected    metabolites of tramadol. ==================================================================== Test                      Result    Flag   Units      Ref Range   Creatinine              207              mg/dL      >=20 ==================================================================== Declared Medications:  The flagging and interpretation on this report are based on the  following declared medications.  Unexpected results may arise from  inaccuracies in the declared medications.  **Note: The testing scope of this panel includes these medications:  Tramadol  **Note: The testing scope of this panel does not include following  reported medications:  Cyclobenzaprine  Fluticasone  Ibuprofen  Meloxicam  Nabumetone  Omeprazole  Pravastatin ==================================================================== For clinical consultation, please call [phone]. ====================================================================    UDS interpretation: Compliant          Medication Assessment Form: Reviewed. Patient indicates being compliant with therapy Treatment compliance: Compliant Risk Assessment Profile: Aberrant behavior: See prior evaluations. None observed or detected today Comorbid factors increasing risk of overdose: See prior notes. No additional risks detected today Risk of substance use  disorder (SUD): Low     Opioid Risk Tool - 05/27/17 0908      Family History of Substance Abuse   Alcohol Negative   Illegal Drugs Positive Male  stepdad   Rx Drugs Negative     Personal History of Substance Abuse   Alcohol Negative   Illegal Drugs Negative   Rx Drugs Negative     Age   Age between 75-45 years  No     History of Preadolescent Sexual Abuse   History of Preadolescent Sexual Abuse Negative or Male     Psychological Disease   Psychological Disease Negative   Depression Negative     Total Score   Opioid Risk Tool Scoring 3   Opioid Risk Interpretation Low Risk     ORT Scoring interpretation table:  Score <3 = Low Risk for SUD  Score between 4-7 = Moderate Risk for SUD  Score >8 = High Risk for Opioid Abuse   Risk Mitigation Strategies:  Patient Counseling: Covered Patient-Prescriber Agreement (PPA): Present and active  Notification to other healthcare providers: Done  Pharmacologic Plan: No change in therapy, at this time  Laboratory Chemistry  Inflammation Markers (CRP: Acute Phase) (ESR: Chronic  Phase) No results found for: CRP, ESRSEDRATE               Renal Function Markers No results found for: BUN, CREATININE, GFRAA, GFRNONAA               Hepatic Function Markers No results found for: AST, ALT, ALBUMIN, ALKPHOS, HCVAB               Electrolytes No results found for: NA, K, CL, CALCIUM, MG               Neuropathy Markers No results found for: [id number]               Bone Pathology Markers No results found for: Hendricks Milo, VD125OH2TOT, [id number], [id number], 25OHVITD1, 25OHVITD2, 25OHVITD3, CALCIUM, TESTOFREE, TESTOSTERONE               Coagulation Parameters No results found for: INR, LABPROT, APTT, PLT               Cardiovascular Markers No results found for: BNP, HGB, HCT               Note: Lab results reviewed.  Recent Diagnostic Imaging Review  US Liver Elastography W/o Img  Result Date: 01/25/2016 CLINICAL DATA:   Fatty liver EXAM: ULTRASOUND HEPATIC ELASTOGRAPHY TECHNIQUE: Ultrasound elastography evaluation of the liver was performed. A region of interest was placed in the right lobe of the liver. Following application of a compressive sonographic pulse, shear waves were detected in the adjacent hepatic tissue and the shear wave velocity was calculated. Multiple assessments were performed at the selected site. Median shear wave velocity is correlated to a Metavir fibrosis score. COMPARISON:  Abdominal ultrasound dated 12/22/2015 FINDINGS: Device: Siemens Helix VTQ Patient position: Supine Transducer 6C1 Number of measurements: 10 Hepatic segment:  8 Median velocity:   2.59  m/sec IQR: 0.29 IQR/Median velocity ratio: 0.11 Corresponding Metavir fibrosis score:  Some F3 + F4 Risk of fibrosis: High Limitations of exam: None Pertinent findings noted on other imaging exams:  None Please note that abnormal shear wave velocities may also be identified in clinical settings other than with hepatic fibrosis, such as: acute hepatitis, elevated right heart and central venous pressures including use of beta blockers, veno-occlusive disease (Budd-Chiari), infiltrative processes such as mastocytosis/amyloidosis/infiltrative tumor, extrahepatic cholestasis, in the post-prandial state, and liver transplantation. Correlation with patient history, laboratory data, and clinical condition recommended. IMPRESSION: Median hepatic shear wave velocity is calculated at 2.59 m/sec. Corresponding Metavir fibrosis score is  Some F3 + F4. Risk of fibrosis is High. Follow-up: Follow up advised Electronically Signed   By: Julian Hy M.D.   On: 01/25/2016 10:51   Note: Imaging results reviewed.          Meds   Current Meds  Medication Sig  . cyclobenzaprine (FLEXERIL) 10 MG tablet Take 1 tablet (10 mg total) by mouth once. prn  . fluticasone (FLONASE) 50 MCG/ACT nasal spray Place into both nostrils daily. prn  . ibuprofen (ADVIL,MOTRIN) 200  MG tablet Take 200 mg by mouth. 2 tabs as needed  . meloxicam (MOBIC) 15 MG tablet Take 15 mg by mouth daily.  Marland Kitchen omeprazole (PRILOSEC) 40 MG capsule Take 40 mg by mouth 3 (three) times daily.  . pravastatin (PRAVACHOL) 10 MG tablet Take 10 mg by mouth daily.  . traMADol (ULTRAM) 50 MG tablet TAKE 1 TABLET BY MOUTH EVERY 6 HOURS AS NEEDED FOR MODERATE PAIN  . [DISCONTINUED] traMADol (ULTRAM) 50 MG tablet  TAKE 1 TABLET BY MOUTH EVERY 6 HOURS AS NEEDED FOR MODERATE PAIN    ROS  Constitutional: Denies any fever or chills Gastrointestinal: No reported hemesis, hematochezia, vomiting, or acute GI distress Musculoskeletal: Denies any acute onset joint swelling, redness, loss of ROM, or weakness Neurological: No reported episodes of acute onset apraxia, aphasia, dysarthria, agnosia, amnesia, paralysis, loss of coordination, or loss of consciousness  Allergies  Mr. Gulley has No Known Allergies.  PFSH  Drug: Mr. Nitschke  has no drug history on file. Alcohol:  has no alcohol history on file. Tobacco:  reports that he has never smoked. He has never used smokeless tobacco. Medical:  has a past medical history of Fatty liver; GERD (gastroesophageal reflux disease); Hypercholesteremia; and Tonsillitis. Surgical: Mr. Wey  has a past surgical history that includes Cervical spine surgery and Elbow surgery (Left). Family: family history includes Diabetes in his mother; Heart disease in his father; Stroke in his father.  Constitutional Exam  General appearance: Well nourished, well developed, and well hydrated. In no apparent acute distress Vitals:   05/27/17 0859  BP: 135/85  Pulse: 81  Resp: 16  Temp: 98.6 F (37 C)  SpO2: 98%  Weight: 230 lb (104.3 kg)  Height: _0  (1.727 m)   BMI Assessment: Estimated body mass index is 34.97 kg/m as calculated from the following:   Height as of this encounter: _1  (1.727 m).   Weight as of this encounter: 230 lb (104.3 kg). Psych/Mental status: Alert,  oriented x 3 (person, place, & time)       Eyes: PERLA Respiratory: No evidence of acute respiratory distress  Cervical Spine Area Exam  Skin & Axial Inspection: Well healed scar from previous spine surgery detected Alignment: Symmetrical Functional ROM: Decreased ROM      Stability: No instability detected Muscle Tone/Strength: Functionally intact. No obvious neuro-muscular anomalies detected. Sensory (Neurological): Unimpaired Palpation: No palpable anomalies              Upper Extremity (UE) Exam    Side: Right upper extremity  Side: Left upper extremity  Skin & Extremity Inspection: Skin color, temperature, and hair growth are WNL. No peripheral edema or cyanosis. No masses, redness, swelling, asymmetry, or associated skin lesions. No contractures.  Skin & Extremity Inspection: Skin color, temperature, and hair growth are WNL. No peripheral edema or cyanosis. No masses, redness, swelling, asymmetry, or associated skin lesions. No contractures.  Functional ROM: Unrestricted ROM          Functional ROM: Unrestricted ROM          Muscle Tone/Strength: Functionally intact. No obvious neuro-muscular anomalies detected.  Muscle Tone/Strength: Functionally intact. No obvious neuro-muscular anomalies detected.  Sensory (Neurological): Unimpaired          Sensory (Neurological): Unimpaired          Palpation: No palpable anomalies              Palpation: No palpable anomalies              Specialized Test(s): Deferred         Specialized Test(s): Deferred           Gait & Posture Assessment  Ambulation: Unassisted Gait: Relatively normal for age and body habitus Posture: WNL   Lower Extremity Exam    Side: Right lower extremity  Side: Left lower extremity  Skin & Extremity Inspection: Skin color, temperature, and hair growth are WNL. No peripheral edema or cyanosis. No masses, redness,  swelling, asymmetry, or associated skin lesions. No contractures.  Skin & Extremity Inspection: Skin color,  temperature, and hair growth are WNL. No peripheral edema or cyanosis. No masses, redness, swelling, asymmetry, or associated skin lesions. No contractures.  Functional ROM: Unrestricted ROM          Functional ROM: Unrestricted ROM          Muscle Tone/Strength: Functionally intact. No obvious neuro-muscular anomalies detected.  Muscle Tone/Strength: Functionally intact. No obvious neuro-muscular anomalies detected.  Sensory (Neurological): Unimpaired  Sensory (Neurological): Unimpaired  Palpation: No palpable anomalies  Palpation: No palpable anomalies   Assessment  Primary Diagnosis & Pertinent Problem List: The primary encounter diagnosis was Chronic neck pain. Diagnoses of DDD (degenerative disc disease), cervical, Chronic pain syndrome, and Long term current use of opiate analgesic were also pertinent to this visit.  Status Diagnosis  Controlled Controlled Controlled 1. Chronic neck pain   2. DDD (degenerative disc disease), cervical   3. Chronic pain syndrome   4. Long term current use of opiate analgesic     Problems updated and reviewed during this visit: Problem  Chronic Pain Syndrome  Ddd (Degenerative Disc Disease), Cervical  Chronic Neck Pain  Long Term Current Use of Opiate Analgesic  Benign Essential Hypertension  Obesity, Unspecified  Pure Hypercholesterolemia  Borderline Diabetes Mellitus  Other Specified Counseling  [place] Counseling   Plan of Care  Pharmacotherapy (Medications Ordered): Meds ordered this encounter  Medications  . traMADol (ULTRAM) 50 MG tablet    Sig: TAKE 1 TABLET BY MOUTH EVERY 6 HOURS AS NEEDED FOR MODERATE PAIN    Dispense:  120 tablet    Refill:  2    Refill every 30 days    Order Specific Question:   Supervising Provider    Answer:   Milinda Pointer [phone]   New Prescriptions   No medications on file   Medications administered today: Mr. Trettel had no medications administered during this visit. Lab-work, procedure(s),  and/or referral(s): No orders of the defined types were placed in this encounter.  Imaging and/or referral(s): None  Interventional therapies: Planned, scheduled, and/or pending:   Not at this time.   Considering:   Not at this time.    Palliative PRN treatment(s):   Not at this time.   Provider-requested follow-up: Return in about 3 months (around 08/27/2017) for MedMgmt.  Future Appointments Date Time Provider [place]  08/27/2017 10:30 AM Vevelyn Francois, NP [facility] None   Primary Care Physician: Dion Body, MD Location: [facility] Note by: Vevelyn Francois NP Date: 05/27/2017; Time: 10:27 AM  Pain Score Disclaimer: We use the NRS-11 scale. This is a self-reported, subjective measurement of pain severity with only modest accuracy. It is used primarily to identify changes within a particular patient. It must be understood that outpatient pain scales are significantly less accurate that those used for research, where they can be applied under ideal controlled circumstances with minimal exposure to variables. In reality, the score is likely to be a combination of pain intensity and pain affect, where pain affect describes the degree of emotional arousal or changes in action readiness caused by the sensory experience of pain. Factors such as social and work situation, setting, emotional state, anxiety levels, expectation, and prior pain experience may influence pain perception and show large inter-individual differences that may also be affected by time variables.  Patient instructions provided during this appointment: Patient Instructions    ____________________________________________________________________________________________  Medication Rules  Applies  to: All patients receiving prescriptions (written or electronic).  Pharmacy of record: Pharmacy where electronic prescriptions will be sent. If written prescriptions are  taken to a different pharmacy, please inform the nursing staff. The pharmacy listed in the electronic medical record should be the one where you would like electronic prescriptions to be sent.  Prescription refills: Only during scheduled appointments. Applies to both, written and electronic prescriptions.  NOTE: The following applies primarily to controlled substances (Opioid* Pain Medications).   Patient's responsibilities: 1. Pain Pills: Bring all pain pills to every appointment (except for procedure appointments). 2. Pill Bottles: Bring pills in original pharmacy bottle. Always bring newest bottle. Bring bottle, even if empty. 3. Medication refills: You are responsible for knowing and keeping track of what medications you need refilled. The day before your appointment, write a list of all prescriptions that need to be refilled. Bring that list to your appointment and give it to the admitting nurse. Prescriptions will be written only during appointments. If you forget a medication, it will not be "Called in", "Faxed", or "electronically sent". You will need to get another appointment to get these prescribed. 4. Prescription Accuracy: You are responsible for carefully inspecting your prescriptions before leaving our office. Have the discharge nurse carefully go over each prescription with you, before taking them home. Make sure that your name is accurately spelled, that your address is correct. Check the name and dose of your medication to make sure it is accurate. Check the number of pills, and the written instructions to make sure they are clear and accurate. Make sure that you are given enough medication to last until your next medication refill appointment. 5. Taking Medication: Take medication as prescribed. Never take more pills than instructed. Never take medication more frequently than prescribed. Taking less pills or less frequently is permitted and encouraged, when it comes to controlled  substances (written prescriptions).  6. Inform other Doctors: Always inform, all of your healthcare providers, of all the medications you take. 7. Pain Medication from other Providers: You are not allowed to accept any additional pain medication from any other Doctor or Healthcare provider. There are two exceptions to this rule. (see below) In the event that you require additional pain medication, you are responsible for notifying us, as stated below. 8. Medication Agreement: You are responsible for carefully reading and following our Medication Agreement. This must be signed before receiving any prescriptions from our practice. Safely store a copy of your signed Agreement. Violations to the Agreement will result in no further prescriptions. (Additional copies of our Medication Agreement are available upon request.) 9. Laws, Rules, & Regulations: All patients are expected to follow all Federal and [employer], [employer], Rules, [employer]. Ignorance of the Laws does not constitute a valid excuse. The use of any illegal substances is prohibited. 10. Adopted CDC guidelines & recommendations: Target dosing levels will be at or below 60 MME/day. Use of benzodiazepines** is not recommended.  Exceptions: There are only two exceptions to the rule of not receiving pain medications from other Healthcare Providers. 1. Exception #1 (Emergencies): In the event of an emergency (i.e.: accident requiring emergency care), you are allowed to receive additional pain medication. However, you are responsible for: As soon as you are able, call our office [phone], at any time of the day or night, and leave a message stating your name, the date and nature of the emergency, and the name and dose of the medication prescribed. In the event that your  call is answered by a member of our staff, make sure to document and save the date, time, and the name of the person that took your information.  2. Exception #2 (Planned  Surgery): In the event that you are scheduled by another doctor or dentist to have any type of surgery or procedure, you are allowed (for a period no longer than 30 days), to receive additional pain medication, for the acute post-op pain. However, in this case, you are responsible for picking up a copy of our "Post-op Pain Management for Surgeons" handout, and giving it to your surgeon or dentist. This document is available at our office, and does not require an appointment to obtain it. Simply go to our office during business hours (Monday-Thursday from 8:00 AM to 4:00 PM) (Friday 8:00 AM to 12:00 Noon) or if you have a scheduled appointment with [place], prior to your surgery, and ask for it by name. In addition, you will need to provide [place] with your name, name of your surgeon, type of surgery, and date of procedure or surgery.  *Opioid medications include: morphine, codeine, oxycodone, oxymorphone, hydrocodone, hydromorphone, meperidine, tramadol, tapentadol, buprenorphine, fentanyl, methadone. **Benzodiazepine medications include: diazepam (Valium), alprazolam (Xanax), clonazepam (Klonopine), lorazepam (Ativan), clorazepate (Tranxene), chlordiazepoxide (Librium), estazolam (Prosom), oxazepam (Serax), temazepam (Restoril), triazolam (Halcion)  ____________________________________________________________________________________________  BMI interpretation table: BMI level Category Range association with higher incidence of chronic pain  <18 kg/m2 Underweight   18.5-24.9 kg/m2 Ideal body weight   25-29.9 kg/m2 Overweight Increased incidence by 20%  30-34.9 kg/m2 Obese (Class I) Increased incidence by 68%  35-39.9 kg/m2 Severe obesity (Class II) Increased incidence by 136%  >40 kg/m2 Extreme obesity (Class III) Increased incidence by 254%   BMI Readings from Last 4 Encounters:  05/27/17 34.97 kg/m  01/27/17 35.73 kg/m  08/28/16 34.67 kg/m  03/21/16 34.18 kg/m   Wt Readings from Last 4  Encounters:  05/27/17 230 lb (104.3 kg)  01/27/17 235 lb (106.6 kg)  08/28/16 228 lb (103.4 kg)  03/21/16 224 lb 12.8 oz (102 kg)

## 2017-08-27 ENCOUNTER — Other Ambulatory Visit: Payer: Self-pay

## 2017-08-27 ENCOUNTER — Ambulatory Visit: Payer: Federal, State, Local not specified - PPO | Attending: Nurse Practitioner | Admitting: Nurse Practitioner

## 2017-08-27 ENCOUNTER — Encounter: Payer: Self-pay | Admitting: Nurse Practitioner

## 2017-08-27 VITALS — BP 125/88 | HR 78 | Temp 98.5°F | Resp 16 | Ht 68.0 in | Wt 218.0 lb

## 2017-08-27 DIAGNOSIS — G894 Chronic pain syndrome: Secondary | ICD-10-CM | POA: Insufficient documentation

## 2017-08-27 DIAGNOSIS — Z8249 Family history of ischemic heart disease and other diseases of the circulatory system: Secondary | ICD-10-CM | POA: Insufficient documentation

## 2017-08-27 DIAGNOSIS — K219 Gastro-esophageal reflux disease without esophagitis: Secondary | ICD-10-CM | POA: Insufficient documentation

## 2017-08-27 DIAGNOSIS — G8929 Other chronic pain: Secondary | ICD-10-CM | POA: Diagnosis not present

## 2017-08-27 DIAGNOSIS — M542 Cervicalgia: Secondary | ICD-10-CM

## 2017-08-27 DIAGNOSIS — E7800 Pure hypercholesterolemia, unspecified: Secondary | ICD-10-CM | POA: Insufficient documentation

## 2017-08-27 DIAGNOSIS — Z823 Family history of stroke: Secondary | ICD-10-CM | POA: Insufficient documentation

## 2017-08-27 DIAGNOSIS — R202 Paresthesia of skin: Secondary | ICD-10-CM | POA: Diagnosis not present

## 2017-08-27 DIAGNOSIS — M5030 Other cervical disc degeneration, unspecified cervical region: Secondary | ICD-10-CM | POA: Diagnosis not present

## 2017-08-27 DIAGNOSIS — Z833 Family history of diabetes mellitus: Secondary | ICD-10-CM | POA: Insufficient documentation

## 2017-08-27 DIAGNOSIS — K760 Fatty (change of) liver, not elsewhere classified: Secondary | ICD-10-CM | POA: Diagnosis not present

## 2017-08-27 DIAGNOSIS — Z6833 Body mass index (BMI) 33.0-33.9, adult: Secondary | ICD-10-CM | POA: Insufficient documentation

## 2017-08-27 DIAGNOSIS — Z9889 Other specified postprocedural states: Secondary | ICD-10-CM | POA: Diagnosis not present

## 2017-08-27 DIAGNOSIS — Z79891 Long term (current) use of opiate analgesic: Secondary | ICD-10-CM | POA: Insufficient documentation

## 2017-08-27 DIAGNOSIS — I10 Essential (primary) hypertension: Secondary | ICD-10-CM | POA: Insufficient documentation

## 2017-08-27 DIAGNOSIS — E669 Obesity, unspecified: Secondary | ICD-10-CM | POA: Diagnosis not present

## 2017-08-27 DIAGNOSIS — Z79899 Other long term (current) drug therapy: Secondary | ICD-10-CM | POA: Diagnosis not present

## 2017-08-27 MED ORDER — TRAMADOL HCL 50 MG PO TABS: ORAL_TABLET | ORAL | 2 refills | Status: DC

## 2017-08-27 NOTE — Progress Notes (Signed)
Patient's Name: Carlos Gentry  MRN: [id number]  Referring Provider: Dion Body, MD  DOB: 07/11/1966  PCP: Dion Body, MD  DOS: 08/27/2017  Note by: Vevelyn Francois NP  Service setting: Ambulatory outpatient  Specialty: Interventional Pain Management  Location: [facility] (AMB) Pain Management Facility    Patient type: Established    Primary Reason(s) for Visit: Encounter for prescription drug management. (Level of risk: moderate)  CC: Neck Pain (radiates to the left hand.)  HPI  Carlos Gentry is a 51 y.o. year old, male patient, who comes today for a medication management evaluation. He has Borderline diabetes mellitus; Chronic neck pain; Benign essential hypertension; Obesity, unspecified; Pure hypercholesterolemia; Other specified counseling; Vaccine counseling; Long term current use of opiate analgesic; Chronic pain syndrome; DDD (degenerative disc disease), cervical; and Tingling in extremities on their problem list. His primarily concern today is the Neck Pain (radiates to the left hand.)  Pain Assessment: Location: Medial Neck Radiating: The pain goes down both arms to the hands but worse on the left. Onset: More than a month ago Duration: Chronic pain Quality: Aching, Tingling, Burning Severity: 6 /10 (self-reported pain score)  Note: Reported level is compatible with observation. Clinically the patient looks like a 1/10 A 1/10 is viewed as "Mild" and described as nagging, annoying, but not interfering with basic activities of daily living (ADL). Carlos Gentry is able to eat, bathe, get dressed, do toileting (being able to get on and off the toilet and perform personal hygiene functions), transfer (move in and out of bed or a chair without assistance), and maintain continence (able to control bladder and bowel functions). Physiologic parameters such as blood pressure and heart rate apear wnl.       When using our objective Pain Scale, levels between 6 and 10/10 are said to belong in an  emergency room, as it progressively worsens from a 6/10, described as severely limiting, requiring emergency care not usually available at an outpatient pain management facility. At a 6/10 level, communication becomes difficult and requires great effort. Assistance to reach the emergency department may be required. Facial flushing and profuse sweating along with potentially dangerous increases in heart rate and blood pressure will be evident. Effect on ADL: limit what I do. Timing: Constant Modifying factors: meds. and no activity, resting.  Carlos Gentry was last scheduled for an appointment on 05/27/2017 for medication management. During today's appointment we reviewed Carlos Gentry chronic pain status, as well as his outpatient medication regimen. He states that he recently started having numbness and tingling on the left arm into his fingers; digits 4 and 5 and occasional on the right. He states that interventions were not effective. He states that he neck pain was resolved with the surgery. He denies any weakness. He was not was not aware that he could have interventional procedures; steroids after surgery. He amdits that his PCP started him on Gabapentin. He has not started this yet secondary to possible side effects of drowsiness. He works third shift and his active. He admtis that he will try it. He states that he believes the starting dose was 300 mg.  He admits that he does have some constipation. He uses OTC Miralax.  He admits that he does not hydrate enough; with water.  The patient  reports that he does not use drugs. His body mass index is 33.15 kg/m.  Further details on both, my assessment(s), as well as the proposed treatment plan, please see below.  Controlled Substance Pharmacotherapy Assessment  REMS (Risk Evaluation and Mitigation Strategy)  Analgesic: Tramadol 36m MME/day: 20 mg/day  PJanett Billow RN  08/27/2017 11:15 AM  Sign at close encounter Nursing Pain Medication  Assessment:  Safety precautions to be maintained throughout the outpatient stay will include: orient to surroundings, keep bed in low position, maintain call bell within reach at all times, provide assistance with transfer out of bed and ambulation.  Medication Inspection Compliance: Pill count conducted under aseptic conditions, in front of the patient. Neither the pills nor the bottle was removed from the patient's sight at any time. Once count was completed pills were immediately returned to the patient in their original bottle.  Medication: Tramadol (Ultram) Pill/Patch Count: 7 of 120 pills remain Pill/Patch Appearance: Markings consistent with prescribed medication Bottle Appearance: Standard pharmacy container. Clearly labeled. Filled Date: 164/ 1 / 2018 Last Medication intake:  Today  MCharna Busman NT  08/27/2017 11:10 AM  Sign at close encounter Safety precautions to be maintained throughout the outpatient stay will include: orient to surroundings, keep bed in low position, maintain call bell within reach at all times, provide assistance with transfer out of bed and ambulation.    Pharmacokinetics: Liberation and absorption (onset of action): WNL Distribution (time to peak effect): WNL Metabolism and excretion (duration of action): WNL         Pharmacodynamics: Desired effects: Analgesia: Mr. SCalixtereports >50% benefit. Functional ability: Patient reports that medication allows him to accomplish basic ADLs Clinically meaningful improvement in function (CMIF): Sustained CMIF goals met Perceived effectiveness: Described as relatively effective, allowing for increase in activities of daily living (ADL) Undesirable effects: Side-effects or Adverse reactions: None reported Monitoring: [place] PMP: Online review of the past 137-montheriod conducted. Compliant with practice rules and regulations Last UDS on record: No results found for: SUMMARY UDS interpretation: Compliant           Medication Assessment Form: Reviewed. Patient indicates being compliant with therapy Treatment compliance: Compliant Risk Assessment Profile: Aberrant behavior: See prior evaluations. None observed or detected today Comorbid factors increasing risk of overdose: See prior notes. No additional risks detected today Risk of substance use disorder (SUD): Low Opioid Risk Tool - 08/27/17 1058      Family History of Substance Abuse   Alcohol  Negative    Illegal Drugs  Negative    Rx Drugs  Negative      Personal History of Substance Abuse   Alcohol  Negative    Illegal Drugs  Negative    Rx Drugs  Negative      Age   Age between 1649-45ears   No      History of Preadolescent Sexual Abuse   History of Preadolescent Sexual Abuse  Negative or Male      Psychological Disease   Psychological Disease  Negative    Depression  Negative      Total Score   Opioid Risk Tool Scoring  0    Opioid Risk Interpretation  Low Risk      ORT Scoring interpretation table:  Score <3 = Low Risk for SUD  Score between 4-7 = Moderate Risk for SUD  Score >8 = High Risk for Opioid Abuse   Risk Mitigation Strategies:  Patient Counseling: Covered Patient-Prescriber Agreement (PPA): Present and active  Notification to other healthcare providers: Done  Pharmacologic Plan: No change in therapy, at this time  Laboratory Chemistry  Inflammation Markers (CRP: Acute Phase) (ESR: Chronic Phase) No results found for: CRP, ESRSEDRATE  Rheumatology Markers No results found for: RF, ANA, LABURIC, URICUR, LYMEIGGIGMAB, LYMEABIGMQN              Renal Function Markers No results found for: BUN, CREATININE, GFRAA, GFRNONAA               Hepatic Function Markers No results found for: AST, ALT, ALBUMIN, ALKPHOS, HCVAB               Electrolytes No results found for: NA, K, CL, CALCIUM, MG               Neuropathy Markers No results found for: VITAMINB12, FOLATE, HGBA1C, HIV                Bone Pathology Markers No results found for: VD25OH, DP824MP5TIR, WE3154MG8, QP6195KD3, 25OHVITD1, 25OHVITD2, 25OHVITD3, TESTOFREE, TESTOSTERONE               Coagulation Parameters No results found for: INR, LABPROT, APTT, PLT, DDIMER               Cardiovascular Markers No results found for: BNP, CKTOTAL, CKMB, TROPONINI, HGB, HCT               CA Markers No results found for: CEA, CA125, LABCA2               Note: Lab results reviewed.  Recent Diagnostic Imaging Results  US LIVER ELASTOGRAPHY W/O IMG CLINICAL DATA:  Fatty liver  EXAM: ULTRASOUND HEPATIC ELASTOGRAPHY  TECHNIQUE: Ultrasound elastography evaluation of the liver was performed. A region of interest was placed in the right lobe of the liver. Following application of a compressive sonographic pulse, shear waves were detected in the adjacent hepatic tissue and the shear wave velocity was calculated. Multiple assessments were performed at the selected site. Median shear wave velocity is correlated to a Metavir fibrosis score.  COMPARISON:  Abdominal ultrasound dated 12/22/2015  FINDINGS: Device: Siemens Helix VTQ  Patient position: Supine  Transducer 6C1  Number of measurements: 10  Hepatic segment:  8  Median velocity:   2.59  m/sec  IQR: 0.29  IQR/Median velocity ratio: 0.11  Corresponding Metavir fibrosis score:  Some F3 + F4  Risk of fibrosis: High  Limitations of exam: None  Pertinent findings noted on other imaging exams:  None  Please note that abnormal shear wave velocities may also be identified in clinical settings other than with hepatic fibrosis, such as: acute hepatitis, elevated right heart and central venous pressures including use of beta blockers, veno-occlusive disease (Budd-Chiari), infiltrative processes such as mastocytosis/amyloidosis/infiltrative tumor, extrahepatic cholestasis, in the post-prandial state, and liver transplantation. Correlation with patient history,  laboratory data, and clinical condition recommended.  IMPRESSION: Median hepatic shear wave velocity is calculated at 2.59 m/sec.  Corresponding Metavir fibrosis score is  Some F3 + F4.  Risk of fibrosis is High.  Follow-up: Follow up advised  Electronically Signed   By: Julian Hy M.D.   On: 01/25/2016 10:51  Complexity Note: Imaging results reviewed. Results shared with Mr. Delair, using Layman's terms.                         Meds   Current Outpatient Medications:  .  cyclobenzaprine (FLEXERIL) 10 MG tablet, Take 1 tablet (10 mg total) by mouth once. prn, Disp: 30 tablet, Rfl: 5 .  fluticasone (FLONASE) 50 MCG/ACT nasal spray, Place into both nostrils daily. prn, Disp: , Rfl:  .  meloxicam (MOBIC) 15 MG  tablet, Take 15 mg by mouth as needed. , Disp: , Rfl:  .  omeprazole (PRILOSEC) 40 MG capsule, Take 40 mg by mouth 3 (three) times daily., Disp: , Rfl:  .  pravastatin (PRAVACHOL) 10 MG tablet, Take 10 mg by mouth daily., Disp: , Rfl:  .  PREVIDENT 5000 SENSITIVE 1.1-5 % PSTE, Take 1 application by mouth daily., Disp: , Rfl: 0 .  [START ON 08/30/2017] traMADol (ULTRAM) 50 MG tablet, TAKE 1 TABLET BY MOUTH EVERY 6 HOURS AS NEEDED FOR MODERATE PAIN, Disp: 120 tablet, Rfl: 2  ROS  Constitutional: Denies any fever or chills Gastrointestinal: No reported hemesis, hematochezia, vomiting, or acute GI distress Musculoskeletal: Denies any acute onset joint swelling, redness, loss of ROM, or weakness Neurological: No reported episodes of acute onset apraxia, aphasia, dysarthria, agnosia, amnesia, paralysis, loss of coordination, or loss of consciousness  Allergies  Mr. Karl has No Known Allergies.  PFSH  Drug: Mr. Terry  reports that he does not use drugs. Alcohol:  reports that he does not drink alcohol. Tobacco:  reports that  has never smoked. he has never used smokeless tobacco. Medical:  has a past medical history of Fatty liver, GERD (gastroesophageal reflux disease),  Hypercholesteremia, and Tonsillitis. Surgical: Mr. Gellert  has a past surgical history that includes Cervical spine surgery and Elbow surgery (Left). Family: family history includes Diabetes in his mother; Heart disease in his father; Stroke in his father.  Constitutional Exam  General appearance: Well nourished, well developed, and well hydrated. In no apparent acute distress Vitals:   08/27/17 1046  BP: 125/88  Pulse: 78  Resp: 16  Temp: 98.5 F (36.9 C)  TempSrc: Oral  SpO2: 100%  Weight: 218 lb (98.9 kg)  Height: 5' 8"  (1.727 m)   BMI Assessment: Estimated body mass index is 33.15 kg/m as calculated from the following:   Height as of this encounter: 5' 8"  (1.727 m).   Weight as of this encounter: 218 lb (98.9 kg).  BMI interpretation table: BMI level Category Range association with higher incidence of chronic pain  <18 kg/m2 Underweight   18.5-24.9 kg/m2 Ideal body weight   25-29.9 kg/m2 Overweight Increased incidence by 20%  30-34.9 kg/m2 Obese (Class I) Increased incidence by 68%  35-39.9 kg/m2 Severe obesity (Class II) Increased incidence by 136%  >40 kg/m2 Extreme obesity (Class III) Increased incidence by 254%   BMI Readings from Last 4 Encounters:  08/27/17 33.15 kg/m  05/27/17 34.97 kg/m  01/27/17 35.73 kg/m  08/28/16 34.67 kg/m   Wt Readings from Last 4 Encounters:  08/27/17 218 lb (98.9 kg)  05/27/17 230 lb (104.3 kg)  01/27/17 235 lb (106.6 kg)  08/28/16 228 lb (103.4 kg)  Psych/Mental status: Alert, oriented x 3 (person, place, & time)       Eyes: PERLA Respiratory: No evidence of acute respiratory distress  Cervical Spine Area Exam  Skin & Axial Inspection: Well healed scar from previous spine surgery detected Alignment: Symmetrical Functional ROM: Unrestricted ROM      Stability: No instability detected Muscle Tone/Strength: Functionally intact. No obvious neuro-muscular anomalies detected. Sensory (Neurological): Unimpaired Palpation: No  palpable anomalies              Upper Extremity (UE) Exam    Side: Right upper extremity  Side: Left upper extremity  Skin & Extremity Inspection: Skin color, temperature, and hair growth are WNL. No peripheral edema or cyanosis. No masses, redness, swelling, asymmetry, or associated skin lesions. No contractures.  Skin &  Extremity Inspection: Skin color, temperature, and hair growth are WNL. No peripheral edema or cyanosis. No masses, redness, swelling, asymmetry, or associated skin lesions. No contractures.  Functional ROM: Unrestricted ROM          Functional ROM: Unrestricted ROM          Muscle Tone/Strength: Functionally intact. No obvious neuro-muscular anomalies detected.  Muscle Tone/Strength: Functionally intact. No obvious neuro-muscular anomalies detected.  Sensory (Neurological): Unimpaired          Sensory (Neurological): Dermatomal pain pattern          Palpation: No palpable anomalies              Palpation: No palpable anomalies              Specialized Test(s): Deferred         Specialized Test(s): Deferred          Gait & Posture Assessment  Ambulation: Unassisted Gait: Relatively normal for age and body habitus Posture: WNL   Assessment  Primary Diagnosis & Pertinent Problem List: The primary encounter diagnosis was Chronic neck pain. Diagnoses of DDD (degenerative disc disease), cervical, Tingling in extremities, Chronic pain syndrome, and Long term current use of opiate analgesic were also pertinent to this visit.  Status Diagnosis  Controlled  Controlled Controlled 1. Chronic neck pain   2. DDD (degenerative disc disease), cervical   3. Tingling in extremities   4. Chronic pain syndrome   5. Long term current use of opiate analgesic     Problems updated and reviewed during this visit: Problem  Chronic Pain Syndrome  Ddd (Degenerative Disc Disease), Cervical  Chronic Neck Pain  Tingling in Extremities   Plan of Care  Pharmacotherapy (Medications  Ordered): Meds ordered this encounter  Medications  . traMADol (ULTRAM) 50 MG tablet    Sig: TAKE 1 TABLET BY MOUTH EVERY 6 HOURS AS NEEDED FOR MODERATE PAIN    Dispense:  120 tablet    Refill:  2    Refill every 30 days    Order Specific Question:   Supervising Provider    Answer:   Milinda Pointer [phone]  This SmartLink is deprecated. Use AVSMEDLIST instead to display the medication list for a patient. Medications administered today: [name] had no medications administered during this visit. Lab-work, procedure(s), and/or referral(s): Orders Placed This Encounter  Procedures  . ToxASSURE Select 13 (MW), Urine   Imaging and/or referral(s): None  Interventional therapies: Planned, scheduled, and/or pending:   Not at this time.   Considering:   Diagnostic CESI.    Palliative PRN treatment(s):   Not at this time.   Provider-requested follow-up: Return in about 3 months (around 11/27/2017) for MedMgmt.  Future Appointments  Date Time Provider [place]  11/26/2017 11:15 AM Vevelyn Francois, NP [facility] None   Primary Care Physician: Dion Body, MD Location: [facility] Out[facility] Management Facility Note by: Vevelyn Francois NP Date: 08/27/2017; Time: 12:08 PM  Pain Score Disclaimer: We use the NRS-11 scale. This is a self-reported, subjective measurement of pain severity with only modest accuracy. It is used primarily to identify changes within a particular patient. It must be understood that outpatient pain scales are significantly less accurate that those used for research, where they can be applied under ideal controlled circumstances with minimal exposure to variables. In reality, the score is likely to be a combination of pain intensity and pain affect, where pain affect describes the degree of emotional arousal or  changes in action readiness caused by the sensory experience of pain. Factors such as social and work situation, setting, emotional  state, anxiety levels, expectation, and prior pain experience may influence pain perception and show large inter-individual differences that may also be affected by time variables.  Patient instructions provided during this appointment: Patient Instructions   ____________________________________________________________________________________________  Medication Rules  Applies to: All patients receiving prescriptions (written or electronic).  Pharmacy of record: Pharmacy where electronic prescriptions will be sent. If written prescriptions are taken to a different pharmacy, please inform the nursing staff. The pharmacy listed in the electronic medical record should be the one where you would like electronic prescriptions to be sent.  Prescription refills: Only during scheduled appointments. Applies to both, written and electronic prescriptions.  NOTE: The following applies primarily to controlled substances (Opioid* Pain Medications).   Patient's responsibilities: 1. Pain Pills: Bring all pain pills to every appointment (except for procedure appointments). 2. Pill Bottles: Bring pills in original pharmacy bottle. Always bring newest bottle. Bring bottle, even if empty. 3. Medication refills: You are responsible for knowing and keeping track of what medications you need refilled. The day before your appointment, write a list of all prescriptions that need to be refilled. Bring that list to your appointment and give it to the admitting nurse. Prescriptions will be written only during appointments. If you forget a medication, it will not be "Called in", "Faxed", or "electronically sent". You will need to get another appointment to get these prescribed. 4. Prescription Accuracy: You are responsible for carefully inspecting your prescriptions before leaving our office. Have the discharge nurse carefully go over each prescription with you, before taking them home. Make sure that your name is  accurately spelled, that your address is correct. Check the name and dose of your medication to make sure it is accurate. Check the number of pills, and the written instructions to make sure they are clear and accurate. Make sure that you are given enough medication to last until your next medication refill appointment. 5. Taking Medication: Take medication as prescribed. Never take more pills than instructed. Never take medication more frequently than prescribed. Taking less pills or less frequently is permitted and encouraged, when it comes to controlled substances (written prescriptions).  6. Inform other Doctors: Always inform, all of your healthcare providers, of all the medications you take. 7. Pain Medication from other Providers: You are not allowed to accept any additional pain medication from any other Doctor or Healthcare provider. There are two exceptions to this rule. (see below) In the event that you require additional pain medication, you are responsible for notifying us, as stated below. 8. Medication Agreement: You are responsible for carefully reading and following our Medication Agreement. This must be signed before receiving any prescriptions from our practice. Safely store a copy of your signed Agreement. Violations to the Agreement will result in no further prescriptions. (Additional copies of our Medication Agreement are available upon request.) 9. Laws, Rules, & Regulations: All patients are expected to follow all Federal and [employer], Rules, [employer]. Ignorance of the Laws does not constitute a valid excuse. The use of any illegal substances is prohibited. 10. Adopted CDC guidelines & recommendations: Target dosing levels will be at or below 60 MME/day. Use of benzodiazepines** is not recommended.  Exceptions: There are only two exceptions to the rule of not receiving pain medications from other Healthcare Providers. 1. Exception #1 (Emergencies): In the event of an  emergency (i.e.: accident  requiring emergency care), you are allowed to receive additional pain medication. However, you are responsible for: As soon as you are able, call our office [phone], at any time of the day or night, and leave a message stating your name, the date and nature of the emergency, and the name and dose of the medication prescribed. In the event that your call is answered by a member of our staff, make sure to document and save the date, time, and the name of the person that took your information.  2. Exception #2 (Planned Surgery): In the event that you are scheduled by another doctor or dentist to have any type of surgery or procedure, you are allowed (for a period no longer than 30 days), to receive additional pain medication, for the acute post-op pain. However, in this case, you are responsible for picking up a copy of our "Post-op Pain Management for Surgeons" handout, and giving it to your surgeon or dentist. This document is available at our office, and does not require an appointment to obtain it. Simply go to our office during business hours (Monday-Thursday from 8:00 AM to 4:00 PM) (Friday 8:00 AM to 12:00 Noon) or if you have a scheduled appointment with [place], prior to your surgery, and ask for it by name. In addition, you will need to provide [place] with your name, name of your surgeon, type of surgery, and date of procedure or surgery.  *Opioid medications include: morphine, codeine, oxycodone, oxymorphone, hydrocodone, hydromorphone, meperidine, tramadol, tapentadol, buprenorphine, fentanyl, methadone. **Benzodiazepine medications include: diazepam (Valium), alprazolam (Xanax), clonazepam (Klonopine), lorazepam (Ativan), clorazepate (Tranxene), chlordiazepoxide (Librium), estazolam (Prosom), oxazepam (Serax), temazepam (Restoril), triazolam (Halcion)  ____________________________________________________________________________________________

## 2017-08-27 NOTE — Progress Notes (Signed)
Safety precautions to be maintained throughout the outpatient stay will include: orient to surroundings, keep bed in low position, maintain call bell within reach at all times, provide assistance with transfer out of bed and ambulation.  

## 2017-08-27 NOTE — Patient Instructions (Signed)

## 2017-08-27 NOTE — Progress Notes (Signed)
Nursing Pain Medication Assessment:  Safety precautions to be maintained throughout the outpatient stay will include: orient to surroundings, keep bed in low position, maintain call bell within reach at all times, provide assistance with transfer out of bed and ambulation.  Medication Inspection Compliance: Pill count conducted under aseptic conditions, in front of the patient. Neither the pills nor the bottle was removed from the patient's sight at any time. Once count was completed pills were immediately returned to the patient in their original bottle.  Medication: Tramadol (Ultram) Pill/Patch Count: 7 of 120 pills remain Pill/Patch Appearance: Markings consistent with prescribed medication Bottle Appearance: Standard pharmacy container. Clearly labeled. Filled Date: 3311 / 1 / 2018 Last Medication intake:  Today

## 2017-11-26 ENCOUNTER — Encounter: Payer: Federal, State, Local not specified - PPO | Admitting: Nurse Practitioner

## 2018-01-13 ENCOUNTER — Ambulatory Visit: Payer: Federal, State, Local not specified - PPO | Attending: Nurse Practitioner | Admitting: Nurse Practitioner

## 2018-01-13 ENCOUNTER — Other Ambulatory Visit: Payer: Self-pay

## 2018-01-13 ENCOUNTER — Encounter: Payer: Self-pay | Admitting: Nurse Practitioner

## 2018-01-13 VITALS — BP 126/85 | HR 86 | Temp 98.7°F | Resp 16 | Ht 68.0 in | Wt 235.0 lb

## 2018-01-13 DIAGNOSIS — M5010 Cervical disc disorder with radiculopathy, unspecified cervical region: Secondary | ICD-10-CM | POA: Insufficient documentation

## 2018-01-13 DIAGNOSIS — M5030 Other cervical disc degeneration, unspecified cervical region: Secondary | ICD-10-CM

## 2018-01-13 DIAGNOSIS — M5412 Radiculopathy, cervical region: Secondary | ICD-10-CM

## 2018-01-13 DIAGNOSIS — E7800 Pure hypercholesterolemia, unspecified: Secondary | ICD-10-CM | POA: Insufficient documentation

## 2018-01-13 DIAGNOSIS — E669 Obesity, unspecified: Secondary | ICD-10-CM | POA: Diagnosis not present

## 2018-01-13 DIAGNOSIS — G894 Chronic pain syndrome: Secondary | ICD-10-CM | POA: Diagnosis not present

## 2018-01-13 DIAGNOSIS — Z79891 Long term (current) use of opiate analgesic: Secondary | ICD-10-CM | POA: Diagnosis not present

## 2018-01-13 DIAGNOSIS — R7303 Prediabetes: Secondary | ICD-10-CM | POA: Insufficient documentation

## 2018-01-13 DIAGNOSIS — Z79899 Other long term (current) drug therapy: Secondary | ICD-10-CM | POA: Diagnosis not present

## 2018-01-13 DIAGNOSIS — Z6835 Body mass index (BMI) 35.0-35.9, adult: Secondary | ICD-10-CM | POA: Diagnosis not present

## 2018-01-13 DIAGNOSIS — K219 Gastro-esophageal reflux disease without esophagitis: Secondary | ICD-10-CM | POA: Diagnosis not present

## 2018-01-13 DIAGNOSIS — G8929 Other chronic pain: Secondary | ICD-10-CM | POA: Diagnosis present

## 2018-01-13 DIAGNOSIS — M542 Cervicalgia: Secondary | ICD-10-CM

## 2018-01-13 DIAGNOSIS — K760 Fatty (change of) liver, not elsewhere classified: Secondary | ICD-10-CM | POA: Diagnosis not present

## 2018-01-13 DIAGNOSIS — Z7951 Long term (current) use of inhaled steroids: Secondary | ICD-10-CM | POA: Diagnosis not present

## 2018-01-13 DIAGNOSIS — I10 Essential (primary) hypertension: Secondary | ICD-10-CM | POA: Insufficient documentation

## 2018-01-13 MED ORDER — TRAMADOL HCL 50 MG PO TABS: ORAL_TABLET | ORAL | 2 refills | Status: DC

## 2018-01-13 MED ORDER — GABAPENTIN 300 MG PO CAPS: 300.0000 mg | ORAL_CAPSULE | Freq: Three times a day (TID) | ORAL | 2 refills | Status: DC

## 2018-01-13 NOTE — Progress Notes (Signed)
Patient's Name: Carlos Gentry  MRN: [id number]  Referring Provider: Dion Body, MD  DOB: November 11, 1965  PCP: Dion Body, MD  DOS: 01/13/2018  Note by: Vevelyn Francois NP  Service setting: Ambulatory outpatient  Specialty: Interventional Pain Management  Location: [facility] (AMB) Pain Management Facility    Patient type: Established    Primary Reason(s) for Visit: Encounter for prescription drug management. (Level of risk: moderate)  CC: Neck Pain  HPI  Mr. Carlos Gentry is a 52 y.o. year old, male patient, who comes today for a medication management evaluation. He has Borderline diabetes mellitus; Chronic neck pain; Benign essential hypertension; Obesity, unspecified; Pure hypercholesterolemia; Other specified counseling; Vaccine counseling; Long term current use of opiate analgesic; Chronic pain syndrome; DDD (degenerative disc disease), cervical; Tingling in extremities; and Cervical radiculitis on their problem list. His primarily concern today is the Neck Pain  Pain Assessment: Location: Medial Neck Radiating: radiates from back of neck down to left arm and hand Onset: More than a month ago Duration: Chronic pain Quality: Aching, Tingling, Numbness Severity: 4 /10 (self-reported pain score)  Note: Reported level is compatible with observation.                          Timing: Constant Modifying factors: Medications; rest  Mr. Main was last scheduled for an appointment on 11/26/2017 for medication management. During today's appointment we reviewed Mr. Carlos Gentry chronic pain status, as well as his outpatient medication regimen. He admits that he is having increased numbness and tingling in his left arm. He admits that it is worse in  his elbow and goes down into his fingers. He also has a pain that goes down his left side. He was started on Gabapentin 327m QHS by his PCP. He does not feel like this is effective. He does wonder how this works. He admits that he was told that he had stenosis. He  had not had any images since 2014. He is SP cervical fusion. MRI indicates narrowing at C7-T1.   The patient  reports that he does not use drugs. His body mass index is 35.73 kg/m.  Further details on both, my assessment(s), as well as the proposed treatment plan, please see below.  Controlled Substance Pharmacotherapy Assessment REMS (Risk Evaluation and Mitigation Strategy)  Analgesic:Tramadol 584mMME/day:2068may   [name]  01/13/2018  3:09 PM  Sign at close encounter Nursing Pain Medication Assessment:  Safety precautions to be maintained throughout the outpatient stay will include: orient to surroundings, keep bed in low position, maintain call bell within reach at all times, provide assistance with transfer out of bed and ambulation.  Medication Inspection Compliance: Pill count conducted under aseptic conditions, in front of the patient. Neither the pills nor the bottle was removed from the patient's sight at any time. Once count was completed pills were immediately returned to the patient in their original bottle.  Medication: Tramadol (Ultram) Pill/Patch Count: 11  of 120 pills remain Pill/Patch Appearance: Markings consistent with prescribed medication Bottle Appearance: Standard pharmacy container. Clearly labeled. Filled Date: 02 / 13 / 2019   Last Medication intake:  TodaySafety precautions to be maintained throughout the outpatient stay will include: orient to surroundings, keep bed in low position, maintain call bell within reach at all times, provide assistance with transfer out of bed and ambulation.    Pharmacokinetics: Liberation and absorption (onset of action): WNL Distribution (time to peak effect): WNL Metabolism and excretion (duration of  action): WNL         Pharmacodynamics: Desired effects: Analgesia: [name] reports >50% benefit. Functional ability: Patient reports that medication allows him to accomplish basic ADLs Clinically meaningful  improvement in function (CMIF): Sustained CMIF goals met Perceived effectiveness: Described as relatively effective, allowing for increase in activities of daily living (ADL) Undesirable effects: Side-effects or Adverse reactions: None reported Monitoring: [place] PMP: Online review of the past 41-monthperiod conducted. Compliant with practice rules and regulations Last UDS on record: No results found for: SUMMARY UDS interpretation: Compliant          Medication Assessment Form: Reviewed. Patient indicates being compliant with therapy Treatment compliance: Compliant Risk Assessment Profile: Aberrant behavior: See prior evaluations. None observed or detected today Comorbid factors increasing risk of overdose: See prior notes. No additional risks detected today Risk of substance use disorder (SUD): Low Opioid Risk Tool - 01/13/18 1503      Family History of Substance Abuse   Alcohol  Positive Male    Illegal Drugs  Negative    Rx Drugs  Negative      Personal History of Substance Abuse   Alcohol  Negative    Illegal Drugs  Negative    Rx Drugs  Negative      Age   Age between 188-45years   No      History of Preadolescent Sexual Abuse   History of Preadolescent Sexual Abuse  Negative or Male      Psychological Disease   Psychological Disease  Negative    Depression  Negative      Total Score   Opioid Risk Tool Scoring  3    Opioid Risk Interpretation  Low Risk      ORT Scoring interpretation table:  Score <3 = Low Risk for SUD  Score between 4-7 = Moderate Risk for SUD  Score >8 = High Risk for Opioid Abuse   Risk Mitigation Strategies:  Patient Counseling: Covered Patient-Prescriber Agreement (PPA): Present and active  Notification to other healthcare providers: Done  Pharmacologic Plan: No change in therapy, at this time.             Laboratory Chemistry  Inflammation Markers (CRP: Acute Phase) (ESR: Chronic Phase) No results found for: CRP, ESRSEDRATE,  LATICACIDVEN                       Rheumatology Markers No results found for: RF, ANA, LABURIC, URICUR, LYMEIGGIGMAB, LYMEABIGMQN                      Renal Function Markers No results found for: BUN, CREATININE, GFRAA, GFRNONAA                            Hepatic Function Markers No results found for: AST, ALT, ALBUMIN, ALKPHOS, HCVAB, AMYLASE, LIPASE, AMMONIA                      Electrolytes No results found for: NA, K, CL, CALCIUM, MG, PHOS                      Neuropathy Markers No results found for: VITAMINB12, FOLATE, HGBA1C, HIV                      Bone Pathology Markers No results found for: [place] [id number] 25OHVITD1, 25OHVITD2, 25OHVITD3, TESTOFREE, TESTOSTERONE  Coagulation Parameters No results found for: INR, LABPROT, APTT, PLT, DDIMER                      Cardiovascular Markers No results found for: BNP, CKTOTAL, CKMB, TROPONINI, HGB, HCT                       CA Markers No results found for: CEA, CA125, LABCA2                      Note: Lab results reviewed.  Recent Diagnostic Imaging Results  US LIVER ELASTOGRAPHY W/O IMG CLINICAL DATA:  Fatty liver  EXAM: ULTRASOUND HEPATIC ELASTOGRAPHY  TECHNIQUE: Ultrasound elastography evaluation of the liver was performed. A region of interest was placed in the right lobe of the liver. Following application of a compressive sonographic pulse, shear waves were detected in the adjacent hepatic tissue and the shear wave velocity was calculated. Multiple assessments were performed at the selected site. Median shear wave velocity is correlated to a Metavir fibrosis score.  COMPARISON:  Abdominal ultrasound dated 12/22/2015  FINDINGS: Device: Siemens Helix VTQ  Patient position: Supine  Transducer 6C1  Number of measurements: 10  Hepatic segment:  8  Median velocity:   2.59  m/sec  IQR: 0.29  IQR/Median velocity ratio: 0.11  Corresponding Metavir fibrosis  score:  Some F3 + F4  Risk of fibrosis: High  Limitations of exam: None  Pertinent findings noted on other imaging exams:  None  Please note that abnormal shear wave velocities may also be identified in clinical settings other than with hepatic fibrosis, such as: acute hepatitis, elevated right heart and central venous pressures including use of beta blockers, veno-occlusive disease (Budd-Chiari), infiltrative processes such as mastocytosis/amyloidosis/infiltrative tumor, extrahepatic cholestasis, in the post-prandial state, and liver transplantation. Correlation with patient history, laboratory data, and clinical condition recommended.  IMPRESSION: Median hepatic shear wave velocity is calculated at 2.59 m/sec.  Corresponding Metavir fibrosis score is  Some F3 + F4.  Risk of fibrosis is High.  Follow-up: Follow up advised  Electronically Signed   By: Julian Hy M.D.   On: 01/25/2016 10:51  Complexity Note: Imaging results reviewed. Results shared with Mr. Langhorst, using Layman's terms.                         Meds   Current Outpatient Medications:  .  cyclobenzaprine (FLEXERIL) 10 MG tablet, Take 1 tablet (10 mg total) by mouth once. prn, Disp: 30 tablet, Rfl: 5 .  fluticasone (FLONASE) 50 MCG/ACT nasal spray, Place into both nostrils daily. prn, Disp: , Rfl:  .  meloxicam (MOBIC) 15 MG tablet, Take 15 mg by mouth as needed. , Disp: , Rfl:  .  omeprazole (PRILOSEC) 40 MG capsule, Take 40 mg by mouth 3 (three) times daily., Disp: , Rfl:  .  pravastatin (PRAVACHOL) 10 MG tablet, Take 10 mg by mouth daily., Disp: , Rfl:  .  PREVIDENT 5000 SENSITIVE 1.1-5 % PSTE, Take 1 application by mouth daily., Disp: , Rfl: 0 .  traMADol (ULTRAM) 50 MG tablet, TAKE 1 TABLET BY MOUTH EVERY 6 HOURS AS NEEDED FOR MODERATE PAIN, Disp: 120 tablet, Rfl: 2 .  gabapentin (NEURONTIN) 300 MG capsule, TAKE 1 CAPSULE BY MOUTH EVERY DAY AT NIGHT, Disp: , Rfl: 3 .  gabapentin (NEURONTIN) 300 MG  capsule, Take 1 capsule (300 mg total) by mouth 3 (three) times daily.,  Disp: 90 capsule, Rfl: 2  ROS  Constitutional: Denies any fever or chills Gastrointestinal: No reported hemesis, hematochezia, vomiting, or acute GI distress Musculoskeletal: Denies any acute onset joint swelling, redness, loss of ROM, or weakness Neurological: No reported episodes of acute onset apraxia, aphasia, dysarthria, agnosia, amnesia, paralysis, loss of coordination, or loss of consciousness  Allergies  Mr. Lesch has No Known Allergies.  PFSH  Drug: Mr. Wesby  reports that he does not use drugs. Alcohol:  reports that he does not drink alcohol. Tobacco:  reports that he has never smoked. He has never used smokeless tobacco. Medical:  has a past medical history of Fatty liver, GERD (gastroesophageal reflux disease), Hypercholesteremia, and Tonsillitis. Surgical: Mr. Matsumoto  has a past surgical history that includes Cervical spine surgery and Elbow surgery (Left). Family: family history includes Diabetes in his mother; Heart disease in his father; Stroke in his father.  Constitutional Exam  General appearance: Well nourished, well developed, and well hydrated. In no apparent acute distress Vitals:   01/13/18 1458  BP: 126/85  Pulse: 86  Resp: 16  Temp: 98.7 F (37.1 C)  SpO2: 100%  Weight: 235 lb (106.6 kg)  Height: 5' 8"  (1.727 m)  Psych/Mental status: Alert, oriented x 3 (person, place, & time)       Eyes: PERLA Respiratory: No evidence of acute respiratory distress  Cervical Spine Area Exam  Skin & Axial Inspection: Well healed scar from previous spine surgery detected Alignment: Symmetrical Functional ROM: Adequate ROM      Stability: No instability detected Muscle Tone/Strength: Guarding observed Sensory (Neurological): Unimpaired Palpation: Complains of area being tender to palpation              Upper Extremity (UE) Exam    Side: Right upper extremity  Side: Left upper extremity  Skin &  Extremity Inspection: Skin color, temperature, and hair growth are WNL. No peripheral edema or cyanosis. No masses, redness, swelling, asymmetry, or associated skin lesions. No contractures.  Skin & Extremity Inspection: Skin color, temperature, and hair growth are WNL. No peripheral edema or cyanosis. No masses, redness, swelling, asymmetry, or associated skin lesions. No contractures.  Functional ROM: Unrestricted ROM          Functional ROM: Unrestricted ROM          Muscle Tone/Strength: Functionally intact. No obvious neuro-muscular anomalies detected.  Muscle Tone/Strength: Functionally intact. No obvious neuro-muscular anomalies detected.  Sensory (Neurological): Unimpaired          Sensory (Neurological): Unimpaired          Palpation: No palpable anomalies              Palpation: No palpable anomalies              Specialized Test(s): Deferred         Specialized Test(s): Deferred          Thoracic Spine Area Exam  Skin & Axial Inspection: No masses, redness, or swelling Alignment: Symmetrical Functional ROM: Unrestricted ROM Stability: No instability detected Muscle Tone/Strength: Functionally intact. No obvious neuro-muscular anomalies detected. Sensory (Neurological): Unimpaired Muscle strength & Tone: No palpable anomalies  Gait & Posture Assessment  Ambulation: Unassisted Gait: Relatively normal for age and body habitus Posture: WNL    Assessment  Primary Diagnosis & Pertinent Problem List: The primary encounter diagnosis was Chronic neck pain. Diagnoses of DDD (degenerative disc disease), cervical, Cervical radiculitis, Chronic pain syndrome, and Long term prescription opiate use were also pertinent to  this visit.  Status Diagnosis  Persistent Persistent Worsening 1. Chronic neck pain   2. DDD (degenerative disc disease), cervical   3. Cervical radiculitis   4. Chronic pain syndrome   5. Long term prescription opiate use     Problems updated and reviewed during this  visit: Problem  Cervical Radiculitis   Plan of Care  Pharmacotherapy (Medications Ordered): Meds ordered this encounter  Medications  . traMADol (ULTRAM) 50 MG tablet    Sig: TAKE 1 TABLET BY MOUTH EVERY 6 HOURS AS NEEDED FOR MODERATE PAIN    Dispense:  120 tablet    Refill:  2    Refill every 30 days    Order Specific Question:   Supervising Provider    Answer:   Milinda Pointer [phone]  . gabapentin (NEURONTIN) 300 MG capsule    Sig: Take 1 capsule (300 mg total) by mouth 3 (three) times daily.    Dispense:  90 capsule    Refill:  2    Do not place this medication, or any other prescription from our practice, on "Automatic Refill". Patient may have prescription filled one day early if pharmacy is closed on scheduled refill date.    Order Specific Question:   Supervising Provider    Answer:   Milinda Pointer [phone]   New Prescriptions   GABAPENTIN (NEURONTIN) 300 MG CAPSULE    Take 1 capsule (300 mg total) by mouth 3 (three) times daily.   Medications administered today: [name] had no medications administered during this visit. Lab-work, procedure(s), and/or referral(s): Orders Placed This Encounter  Procedures  . DG Cervical Spine With Flex & Extend  . ToxASSURE Select 13 (MW), Urine   Imaging and/or referral(s): DG CERVICAL SPINE WITH FLEX & EXTEND  Interventional therapies: Planned, scheduled, and/or pending:   Not at this time.   Considering:   L-CESI   Provider-requested follow-up: Return in about 3 months (around 04/14/2018) for MedMgmt with Me Donella Stade [facility] Pace).  Future Appointments  Date Time Provider [place]  04/09/2018 11:00 AM Vevelyn Francois, NP [facility] None   Primary Care Physician: Dion Body, MD Location: [facility] Outpatient Pain Management Facility Note by: Vevelyn Francois NP Date: 01/13/2018; Time: 4:01 PM  Pain Score Disclaimer: We use the NRS-11 scale. This is a self-reported, subjective measurement of pain severity  with only modest accuracy. It is used primarily to identify changes within a particular patient. It must be understood that outpatient pain scales are significantly less accurate that those used for research, where they can be applied under ideal controlled circumstances with minimal exposure to variables. In reality, the score is likely to be a combination of pain intensity and pain affect, where pain affect describes the degree of emotional arousal or changes in action readiness caused by the sensory experience of pain. Factors such as social and work situation, setting, emotional state, anxiety levels, expectation, and prior pain experience may influence pain perception and show large inter-individual differences that may also be affected by time variables.  Patient instructions provided during this appointment: Patient Instructions  ___You have been given 1 prescription for Tramadol 72m and Gabapentin 3026msent to your pharmacy.  _________________________________________________________________________________________  Medication Rules  Applies to: All patients receiving prescriptions (written or electronic).  Pharmacy of record: Pharmacy where electronic prescriptions will be sent. If written prescriptions are taken to a different pharmacy, please inform the nursing staff. The pharmacy listed in the electronic medical record should be the one where you would like  electronic prescriptions to be sent.  Prescription refills: Only during scheduled appointments. Applies to both, written and electronic prescriptions.  NOTE: The following applies primarily to controlled substances (Opioid* Pain Medications).   Patient's responsibilities: 1. Pain Pills: Bring all pain pills to every appointment (except for procedure appointments). 2. Pill Bottles: Bring pills in original pharmacy bottle. Always bring newest bottle. Bring bottle, even if empty. 3. Medication refills: You are responsible for knowing  and keeping track of what medications you need refilled. The day before your appointment, write a list of all prescriptions that need to be refilled. Bring that list to your appointment and give it to the admitting nurse. Prescriptions will be written only during appointments. If you forget a medication, it will not be "Called in", "Faxed", or "electronically sent". You will need to get another appointment to get these prescribed. 4. Prescription Accuracy: You are responsible for carefully inspecting your prescriptions before leaving our office. Have the discharge nurse carefully go over each prescription with you, before taking them home. Make sure that your name is accurately spelled, that your address is correct. Check the name and dose of your medication to make sure it is accurate. Check the number of pills, and the written instructions to make sure they are clear and accurate. Make sure that you are given enough medication to last until your next medication refill appointment. 5. Taking Medication: Take medication as prescribed. Never take more pills than instructed. Never take medication more frequently than prescribed. Taking less pills or less frequently is permitted and encouraged, when it comes to controlled substances (written prescriptions).  6. Inform other Doctors: Always inform, all of your healthcare providers, of all the medications you take. 7. Pain Medication from other Providers: You are not allowed to accept any additional pain medication from any other Doctor or Healthcare provider. There are two exceptions to this rule. (see below) In the event that you require additional pain medication, you are responsible for notifying us, as stated below. 8. Medication Agreement: You are responsible for carefully reading and following our Medication Agreement. This must be signed before receiving any prescriptions from our practice. Safely store a copy of your signed Agreement. Violations to the  Agreement will result in no further prescriptions. (Additional copies of our Medication Agreement are available upon request.) 9. Laws, Rules, & Regulations: All patients are expected to follow all Federal and [employer], Rules, [employer]. Ignorance of the Laws does not constitute a valid excuse. The use of any illegal substances is prohibited. 10. Adopted CDC guidelines & recommendations: Target dosing levels will be at or below 60 MME/day. Use of benzodiazepines** is not recommended.  Exceptions: There are only two exceptions to the rule of not receiving pain medications from other Healthcare Providers. 1. Exception #1 (Emergencies): In the event of an emergency (i.e.: accident requiring emergency care), you are allowed to receive additional pain medication. However, you are responsible for: As soon as you are able, call our office [phone], at any time of the day or night, and leave a message stating your name, the date and nature of the emergency, and the name and dose of the medication prescribed. In the event that your call is answered by a member of our staff, make sure to document and save the date, time, and the name of the person that took your information.  2. Exception #2 (Planned Surgery): In the event that you are scheduled by another doctor or dentist to have any  type of surgery or procedure, you are allowed (for a period no longer than 30 days), to receive additional pain medication, for the acute post-op pain. However, in this case, you are responsible for picking up a copy of our "Post-op Pain Management for Surgeons" handout, and giving it to your surgeon or dentist. This document is available at our office, and does not require an appointment to obtain it. Simply go to our office during business hours (Monday-Thursday from 8:00 AM to 4:00 PM) (Friday 8:00 AM to 12:00 Noon) or if you have a scheduled appointment with [place], prior to your surgery, and ask for it by name. In  addition, you will need to provide [place] with your name, name of your surgeon, type of surgery, and date of procedure or surgery.  *Opioid medications include: morphine, codeine, oxycodone, oxymorphone, hydrocodone, hydromorphone, meperidine, tramadol, tapentadol, buprenorphine, fentanyl, methadone. **Benzodiazepine medications include: diazepam (Valium), alprazolam (Xanax), clonazepam (Klonopine), lorazepam (Ativan), clorazepate (Tranxene), chlordiazepoxide (Librium), estazolam (Prosom), oxazepam (Serax), temazepam (Restoril), triazolam (Halcion) (Last updated: 11/27/2017) ____________________________________________________________________________________________

## 2018-01-13 NOTE — Patient Instructions (Addendum)
___You have been given 1 prescription for Tramadol 50mg  and Gabapentin 300mg  sent to your pharmacy.  _________________________________________________________________________________________  Medication Rules  Applies to: All patients receiving prescriptions (written or electronic).  Pharmacy of record: Pharmacy where electronic prescriptions will be sent. If written prescriptions are taken to a different pharmacy, please inform the nursing staff. The pharmacy listed in the electronic medical record should be the one where you would like electronic prescriptions to be sent.  Prescription refills: Only during scheduled appointments. Applies to both, written and electronic prescriptions.  NOTE: The following applies primarily to controlled substances (Opioid* Pain Medications).   Patient's responsibilities: 1. Pain Pills: Bring all pain pills to every appointment (except for procedure appointments). 2. Pill Bottles: Bring pills in original pharmacy bottle. Always bring newest bottle. Bring bottle, even if empty. 3. Medication refills: You are responsible for knowing and keeping track of what medications you need refilled. The day before your appointment, write a list of all prescriptions that need to be refilled. Bring that list to your appointment and give it to the admitting nurse. Prescriptions will be written only during appointments. If you forget a medication, it will not be "Called in", "Faxed", or "electronically sent". You will need to get another appointment to get these prescribed. 4. Prescription Accuracy: You are responsible for carefully inspecting your prescriptions before leaving our office. Have the discharge nurse carefully go over each prescription with you, before taking them home. Make sure that your name is accurately spelled, that your address is correct. Check the name and dose of your medication to make sure it is accurate. Check the number of pills, and the written  instructions to make sure they are clear and accurate. Make sure that you are given enough medication to last until your next medication refill appointment. 5. Taking Medication: Take medication as prescribed. Never take more pills than instructed. Never take medication more frequently than prescribed. Taking less pills or less frequently is permitted and encouraged, when it comes to controlled substances (written prescriptions).  6. Inform other Doctors: Always inform, all of your healthcare providers, of all the medications you take. 7. Pain Medication from other Providers: You are not allowed to accept any additional pain medication from any other Doctor or Healthcare provider. There are two exceptions to this rule. (see below) In the event that you require additional pain medication, you are responsible for notifying us, as stated below. 8. Medication Agreement: You are responsible for carefully reading and following our Medication Agreement. This must be signed before receiving any prescriptions from our practice. Safely store a copy of your signed Agreement. Violations to the Agreement will result in no further prescriptions. (Additional copies of our Medication Agreement are available upon request.) 9. Laws, Rules, & Regulations: All patients are expected to follow all [address] and [place] DisneyState Laws, [employer] IndustriesStatutes, Rules, [place] [employer] [place] [employer] Regulations. Ignorance of the Laws does not constitute a valid excuse. The use of any illegal substances is prohibited. 10. Adopted CDC guidelines & recommendations: Target dosing levels will be at or below 60 MME/day. Use of benzodiazepines** is not recommended.  Exceptions: There are only two exceptions to the rule of not receiving pain medications from other Healthcare Providers. 1. Exception #1 (Emergencies): In the event of an emergency (i.e.: accident requiring emergency care), you are allowed to receive additional pain medication. However, you are responsible for: As soon as you are  able, call our office [phone], at any time of the day or night, and leave a message stating  your name, the date and nature of the emergency, and the name and dose of the medication prescribed. In the event that your call is answered by a member of our staff, make sure to document and save the date, time, and the name of the person that took your information.  2. Exception #2 (Planned Surgery): In the event that you are scheduled by another doctor or dentist to have any type of surgery or procedure, you are allowed (for a period no longer than 30 days), to receive additional pain medication, for the acute post-op pain. However, in this case, you are responsible for picking up a copy of our "Post-op Pain Management for Surgeons" handout, and giving it to your surgeon or dentist. This document is available at our office, and does not require an appointment to obtain it. Simply go to our office during business hours (Monday-Thursday from 8:00 AM to 4:00 PM) (Friday 8:00 AM to 12:00 Noon) or if you have a scheduled appointment with [place], prior to your surgery, and ask for it by name. In addition, you will need to provide [place] with your name, name of your surgeon, type of surgery, and date of procedure or surgery.  *Opioid medications include: morphine, codeine, oxycodone, oxymorphone, hydrocodone, hydromorphone, meperidine, tramadol, tapentadol, buprenorphine, fentanyl, methadone. **Benzodiazepine medications include: diazepam (Valium), alprazolam (Xanax), clonazepam (Klonopine), lorazepam (Ativan), clorazepate (Tranxene), chlordiazepoxide (Librium), estazolam (Prosom), oxazepam (Serax), temazepam (Restoril), triazolam (Halcion) (Last updated: 11/27/2017) ____________________________________________________________________________________________

## 2018-01-13 NOTE — Progress Notes (Signed)
Nursing Pain Medication Assessment:  Safety precautions to be maintained throughout the outpatient stay will include: orient to surroundings, keep bed in low position, maintain call bell within reach at all times, provide assistance with transfer out of bed and ambulation.  Medication Inspection Compliance: Pill count conducted under aseptic conditions, in front of the patient. Neither the pills nor the bottle was removed from the patient's sight at any time. Once count was completed pills were immediately returned to the patient in their original bottle.  Medication: Tramadol (Ultram) Pill/Patch Count: 11  of 120 pills remain Pill/Patch Appearance: Markings consistent with prescribed medication Bottle Appearance: Standard pharmacy container. Clearly labeled. Filled Date: 02 / 13 / 2019   Last Medication intake:  TodaySafety precautions to be maintained throughout the outpatient stay will include: orient to surroundings, keep bed in low position, maintain call bell within reach at all times, provide assistance with transfer out of bed and ambulation.

## 2018-01-16 LAB — TOXASSURE SELECT 13 (MW), URINE

## 2018-04-09 ENCOUNTER — Encounter: Payer: Federal, State, Local not specified - PPO | Admitting: Nurse Practitioner

## 2018-04-30 ENCOUNTER — Encounter: Payer: Self-pay | Admitting: Nurse Practitioner

## 2018-04-30 ENCOUNTER — Ambulatory Visit: Payer: Federal, State, Local not specified - PPO | Attending: Nurse Practitioner | Admitting: Nurse Practitioner

## 2018-04-30 ENCOUNTER — Telehealth: Payer: Self-pay

## 2018-04-30 ENCOUNTER — Other Ambulatory Visit: Payer: Self-pay

## 2018-04-30 VITALS — BP 121/91 | HR 84 | Temp 98.2°F | Resp 16 | Ht 68.0 in | Wt 226.0 lb

## 2018-04-30 DIAGNOSIS — I10 Essential (primary) hypertension: Secondary | ICD-10-CM | POA: Diagnosis not present

## 2018-04-30 DIAGNOSIS — E7800 Pure hypercholesterolemia, unspecified: Secondary | ICD-10-CM | POA: Insufficient documentation

## 2018-04-30 DIAGNOSIS — Z79891 Long term (current) use of opiate analgesic: Secondary | ICD-10-CM | POA: Insufficient documentation

## 2018-04-30 DIAGNOSIS — M542 Cervicalgia: Secondary | ICD-10-CM | POA: Diagnosis not present

## 2018-04-30 DIAGNOSIS — K219 Gastro-esophageal reflux disease without esophagitis: Secondary | ICD-10-CM | POA: Diagnosis not present

## 2018-04-30 DIAGNOSIS — K760 Fatty (change of) liver, not elsewhere classified: Secondary | ICD-10-CM | POA: Diagnosis not present

## 2018-04-30 DIAGNOSIS — M5412 Radiculopathy, cervical region: Secondary | ICD-10-CM | POA: Diagnosis not present

## 2018-04-30 DIAGNOSIS — Z823 Family history of stroke: Secondary | ICD-10-CM | POA: Diagnosis not present

## 2018-04-30 DIAGNOSIS — Z791 Long term (current) use of non-steroidal anti-inflammatories (NSAID): Secondary | ICD-10-CM | POA: Diagnosis not present

## 2018-04-30 DIAGNOSIS — G8929 Other chronic pain: Secondary | ICD-10-CM

## 2018-04-30 DIAGNOSIS — M5030 Other cervical disc degeneration, unspecified cervical region: Secondary | ICD-10-CM | POA: Diagnosis not present

## 2018-04-30 DIAGNOSIS — Z8249 Family history of ischemic heart disease and other diseases of the circulatory system: Secondary | ICD-10-CM | POA: Diagnosis not present

## 2018-04-30 DIAGNOSIS — E669 Obesity, unspecified: Secondary | ICD-10-CM | POA: Diagnosis not present

## 2018-04-30 DIAGNOSIS — G894 Chronic pain syndrome: Secondary | ICD-10-CM | POA: Diagnosis not present

## 2018-04-30 DIAGNOSIS — Z79899 Other long term (current) drug therapy: Secondary | ICD-10-CM | POA: Diagnosis not present

## 2018-04-30 DIAGNOSIS — Z833 Family history of diabetes mellitus: Secondary | ICD-10-CM | POA: Diagnosis not present

## 2018-04-30 DIAGNOSIS — J039 Acute tonsillitis, unspecified: Secondary | ICD-10-CM | POA: Insufficient documentation

## 2018-04-30 MED ORDER — GABAPENTIN 300 MG PO CAPS: 300.0000 mg | ORAL_CAPSULE | Freq: Three times a day (TID) | ORAL | 2 refills | Status: DC

## 2018-04-30 MED ORDER — TRAMADOL HCL 50 MG PO TABS: ORAL_TABLET | ORAL | 2 refills | Status: DC

## 2018-04-30 NOTE — Telephone Encounter (Signed)
Spoke with patient regarding FMLA papers.  Patient requesting that we make sure that the dates of July 19 - 24 are covered by the [facility].  Explained to patient that I could only put down the dates he was here and the reason he was here.  Patient asked if I knew what I was doing.  Explained to patient that he has only came every 3 months for medication management within the last couple of years and that's what I would have to put on the form.  Discussed with Dr Pernell DupreAdams and with Colonel Bald. King NP. Attempted to call patient to inform him that the papers were complete.

## 2018-04-30 NOTE — Patient Instructions (Addendum)
You have been given Rx for Tramadol with 2 refills to last until 07/29/2018.  Gabapentin has been escribed to your pharmacy.  You will have XRays done prior to next appt. with pain clinic.____________________________________________________________________________________________  Medication Rules  Applies to: All patients receiving prescriptions (written or electronic).  Pharmacy of record: Pharmacy where electronic prescriptions will be sent. If written prescriptions are taken to a different pharmacy, please inform the nursing staff. The pharmacy listed in the electronic medical record should be the one where you would like electronic prescriptions to be sent.  Prescription refills: Only during scheduled appointments. Applies to both, written and electronic prescriptions.  NOTE: The following applies primarily to controlled substances (Opioid* Pain Medications).   Patient's responsibilities: 1. Pain Pills: Bring all pain pills to every appointment (except for procedure appointments). 2. Pill Bottles: Bring pills in original pharmacy bottle. Always bring newest bottle. Bring bottle, even if empty. 3. Medication refills: You are responsible for knowing and keeping track of what medications you need refilled. The day before your appointment, write a list of all prescriptions that need to be refilled. Bring that list to your appointment and give it to the admitting nurse. Prescriptions will be written only during appointments. If you forget a medication, it will not be "Called in", "Faxed", or "electronically sent". You will need to get another appointment to get these prescribed. 4. Prescription Accuracy: You are responsible for carefully inspecting your prescriptions before leaving our office. Have the discharge nurse carefully go over each prescription with you, before taking them home. Make sure that your name is accurately spelled, that your address is correct. Check the name and dose of your  medication to make sure it is accurate. Check the number of pills, and the written instructions to make sure they are clear and accurate. Make sure that you are given enough medication to last until your next medication refill appointment. 5. Taking Medication: Take medication as prescribed. Never take more pills than instructed. Never take medication more frequently than prescribed. Taking less pills or less frequently is permitted and encouraged, when it comes to controlled substances (written prescriptions).  6. Inform other Doctors: Always inform, all of your healthcare providers, of all the medications you take. 7. Pain Medication from other Providers: You are not allowed to accept any additional pain medication from any other Doctor or Healthcare provider. There are two exceptions to this rule. (see below) In the event that you require additional pain medication, you are responsible for notifying us, as stated below. 8. Medication Agreement: You are responsible for carefully reading and following our Medication Agreement. This must be signed before receiving any prescriptions from our practice. Safely store a copy of your signed Agreement. Violations to the Agreement will result in no further prescriptions. (Additional copies of our Medication Agreement are available upon request.) 9. Laws, Rules, & Regulations: All patients are expected to follow all [address]Federal and [employer] DisneyState Laws, [employer]Statutes, Rules, [place] [employer] [place] [employer]& Regulations. Ignorance of the Laws does not constitute a valid excuse. The use of any illegal substances is prohibited. 10. Adopted CDC guidelines & recommendations: Target dosing levels will be at or below 60 MME/day. Use of benzodiazepines** is not recommended.  Exceptions: There are only two exceptions to the rule of not receiving pain medications from other Healthcare Providers. 1. Exception #1 (Emergencies): In the event of an emergency (i.e.: accident requiring emergency care), you are allowed to receive  additional pain medication. However, you are responsible for: As soon as you are able, call  our office [phone], at any time of the day or night, and leave a message stating your name, the date and nature of the emergency, and the name and dose of the medication prescribed. In the event that your call is answered by a member of our staff, make sure to document and save the date, time, and the name of the person that took your information.  2. Exception #2 (Planned Surgery): In the event that you are scheduled by another doctor or dentist to have any type of surgery or procedure, you are allowed (for a period no longer than 30 days), to receive additional pain medication, for the acute post-op pain. However, in this case, you are responsible for picking up a copy of our "Post-op Pain Management for Surgeons" handout, and giving it to your surgeon or dentist. This document is available at our office, and does not require an appointment to obtain it. Simply go to our office during business hours (Monday-Thursday from 8:00 AM to 4:00 PM) (Friday 8:00 AM to 12:00 Noon) or if you have a scheduled appointment with [place], prior to your surgery, and ask for it by name. In addition, you will need to provide [place] with your name, name of your surgeon, type of surgery, and date of procedure or surgery.  *Opioid medications include: morphine, codeine, oxycodone, oxymorphone, hydrocodone, hydromorphone, meperidine, tramadol, tapentadol, buprenorphine, fentanyl, methadone. **Benzodiazepine medications include: diazepam (Valium), alprazolam (Xanax), clonazepam (Klonopine), lorazepam (Ativan), clorazepate (Tranxene), chlordiazepoxide (Librium), estazolam (Prosom), oxazepam (Serax), temazepam (Restoril), triazolam (Halcion) (Last updated: 11/27/2017) ____________________________________________________________________________________________

## 2018-04-30 NOTE — Progress Notes (Signed)
Nursing Pain Medication Assessment:  Safety precautions to be maintained throughout the outpatient stay will include: orient to surroundings, keep bed in low position, maintain call bell within reach at all times, provide assistance with transfer out of bed and ambulation.  Medication Inspection Compliance: Pill count conducted under aseptic conditions, in front of the patient. Neither the pills nor the bottle was removed from the patient's sight at any time. Once count was completed pills were immediately returned to the patient in their original bottle.  Medication: Tramadol (Ultram) Pill/Patch Count: 28 of 120 pills remain Pill/Patch Appearance: Markings consistent with prescribed medication Bottle Appearance: Standard pharmacy container. Clearly labeled. Filled Date: 7 / 1 / 2019 Last Medication intake:  Today

## 2018-04-30 NOTE — Progress Notes (Signed)
Patient's Name: Carlos Gentry  MRN: [id number]  Referring Provider: Dion Body, MD  DOB: 07/30/1966  PCP: Dion Body, MD  DOS: 04/30/2018  Note by: Vevelyn Francois NP  Service setting: Ambulatory outpatient  Specialty: Interventional Pain Management  Location: [facility] (AMB) Pain Management Facility    Patient type: Established    Primary Reason(s) for Visit: Encounter for prescription drug management. (Level of risk: moderate)  CC: Neck Pain  HPI  Carlos Gentry is a 52 y.o. year old, male patient, who comes today for a medication management evaluation. He has Borderline diabetes mellitus; Chronic neck pain; Benign essential hypertension; Obesity, unspecified; Pure hypercholesterolemia; Other specified counseling; Vaccine counseling; Long term current use of opiate analgesic; Chronic pain syndrome; DDD (degenerative disc disease), cervical; Tingling in extremities; and Cervical radiculitis on their problem list. His primarily concern today is the Neck Pain  Pain Assessment: Location: Medial Neck Radiating: denies Onset: More than a month ago Duration: Chronic pain Quality: Aching, Burning, Constant Severity: 7 /10 (subjective, self-reported pain score)  Note: Reported level is compatible with observation. Clinically the patient looks like a 3/10 A 3/10 is viewed as "Moderate" and described as significantly interfering with activities of daily living (ADL). It becomes difficult to feed, bathe, get dressed, get on and off the toilet or to perform personal hygiene functions. Difficult to get in and out of bed or a chair without assistance. Very distracting. With effort, it can be ignored when deeply involved in activities. Information on the proper use of the pain scale provided to the patient today. When using our objective Pain Scale, levels between 6 and 10/10 are said to belong in an emergency room, as it progressively worsens from a 6/10, described as severely limiting, requiring emergency  care not usually available at an outpatient pain management facility. At a 6/10 level, communication becomes difficult and requires great effort. Assistance to reach the emergency department may be required. Facial flushing and profuse sweating along with potentially dangerous increases in heart rate and blood pressure will be evident. Timing: Constant Modifying factors: tramadol BP: (!) 121/91  HR: 84  Carlos Gentry was last scheduled for an appointment on 01/13/2018 for medication management. During today's appointment we reviewed [name] chronic pain status, as well as his outpatient medication regimen.  He admits that he did have a recent flareup secondary to increased moving and lifting.  He admits that he was out of work and was seen at urgent care.  Denies any recent images.  He was to have cervical x-rays 3 months ago however have not had those completed at this time.  He admits that he does have some numbness and tingling on the left side when he is not using the gabapentin.  Admits that he has started using gabapentin 1 to 2 tablets daily.  The patient  reports that he does not use drugs. His body mass index is 34.36 kg/m.  Further details on both, my assessment(s), as well as the proposed treatment plan, please see below.  Controlled Substance Pharmacotherapy Assessment REMS (Risk Evaluation and Mitigation Strategy)  Analgesic:Tramadol 70m MME/day:273mday   WeRise PatienceRN  04/30/2018 11:37 AM  Sign at close encounter Nursing Pain Medication Assessment:  Safety precautions to be maintained throughout the outpatient stay will include: orient to surroundings, keep bed in low position, maintain call bell within reach at all times, provide assistance with transfer out of bed and ambulation.  Medication Inspection Compliance: Pill count conducted under aseptic conditions, in front  of the patient. Neither the pills nor the bottle was removed from the patient's sight at any time. Once  count was completed pills were immediately returned to the patient in their original bottle.  Medication: Tramadol (Ultram) Pill/Patch Count: 28 of 120 pills remain Pill/Patch Appearance: Markings consistent with prescribed medication Bottle Appearance: Standard pharmacy container. Clearly labeled. Filled Date: 7 / 1 / 2019 Last Medication intake:  Today   Pharmacokinetics: Liberation and absorption (onset of action): WNL Distribution (time to peak effect): WNL Metabolism and excretion (duration of action): WNL         Pharmacodynamics: Desired effects: Analgesia: Carlos Gentry reports >50% benefit. Functional ability: Patient reports that medication allows him to accomplish basic ADLs Clinically meaningful improvement in function (CMIF): Sustained CMIF goals met Perceived effectiveness: Described as relatively effective, allowing for increase in activities of daily living (ADL) Undesirable effects: Side-effects or Adverse reactions: None reported Monitoring: [place] PMP: Online review of the past 27-monthperiod conducted. Compliant with practice rules and regulations Last UDS on record: Summary  Date Value Ref Range Status  01/13/2018 FINAL  Final    Comment:    ==================================================================== TOXASSURE SELECT 13 (MW) ==================================================================== Test                             Result       Flag       Units Drug Present and Declared for Prescription Verification   Tramadol                       >1623        EXPECTED   ng/mg creat   O-Desmethyltramadol            >1623        EXPECTED   ng/mg creat   N-Desmethyltramadol            815          EXPECTED   ng/mg creat    Source of tramadol is a prescription medication.    O-desmethyltramadol and N-desmethyltramadol are expected    metabolites of tramadol. ==================================================================== Test                      Result     Flag   Units      Ref Range   Creatinine              308              mg/dL      >=20 ==================================================================== Declared Medications:  The flagging and interpretation on this report are based on the  following declared medications.  Unexpected results may arise from  inaccuracies in the declared medications.  **Note: The testing scope of this panel includes these medications:  Tramadol  **Note: The testing scope of this panel does not include following  reported medications:  Cyclobenzaprine  Fluticasone  Gabapentin  Meloxicam [  Omeprazole  Pravastatin  Supplement ==================================================================== For clinical consultation, please call ([phone] ====================================================================    UDS interpretation: Compliant          Medication Assessment Form: Reviewed. Patient indicates being compliant with therapy Treatment compliance: Compliant Risk Assessment Profile: Aberrant behavior: See prior evaluations. None observed or detected today Comorbid factors increasing risk of overdose: See prior notes. No additional risks detected today Risk of substance use disorder (SUD): Low Opioid Risk Tool - 04/30/18 1133  Family History of Substance Abuse   Alcohol  Negative    Illegal Drugs  Negative    Rx Drugs  Negative      Personal History of Substance Abuse   Alcohol  Negative    Illegal Drugs  Negative    Rx Drugs  Negative      Age   Age between 73-45 years   No      History of Preadolescent Sexual Abuse   History of Preadolescent Sexual Abuse  Negative or Male      Psychological Disease   Psychological Disease  Negative    Depression  Negative      Total Score   Opioid Risk Tool Scoring  0    Opioid Risk Interpretation  Low Risk      ORT Scoring interpretation table:  Score <3 = Low Risk for SUD  Score between 4-7 = Moderate Risk for SUD  Score  >8 = High Risk for Opioid Abuse   Risk Mitigation Strategies:  Patient Counseling: Covered Patient-Prescriber Agreement (PPA): Present and active  Notification to other healthcare providers: Done  Pharmacologic Plan: No change in therapy, at this time.             Laboratory Chemistry  Inflammation Markers (CRP: Acute Phase) (ESR: Chronic Phase) No results found for: CRP, ESRSEDRATE, LATICACIDVEN                       Rheumatology Markers No results found for: RF, ANA, LABURIC, URICUR, LYMEIGGIGMAB, LYMEABIGMQN, HLAB27                      Renal Function Markers No results found for: BUN, CREATININE, BCR, GFRAA, GFRNONAA                           Hepatic Function Markers No results found for: AST, ALT, ALBUMIN, ALKPHOS, HCVAB, AMYLASE, LIPASE, AMMONIA                      Electrolytes No results found for: NA, K, CL, CALCIUM, MG, PHOS                      Neuropathy Markers No results found for: VITAMINB12, FOLATE, HGBA1C, HIV                      Bone Pathology Markers No results found for: VD25OH, [id number], [id number], [id number], 25OHVITD1, 25OHVITD2, 25OHVITD3, TESTOFREE, TESTOSTERONE                       Coagulation Parameters No results found for: INR, LABPROT, APTT, PLT, DDIMER                      Cardiovascular Markers No results found for: BNP, CKTOTAL, CKMB, TROPONINI, HGB, HCT                       CA Markers No results found for: CEA, CA125, LABCA2                      Note: Lab results reviewed.  Recent Diagnostic Imaging Results  US LIVER ELASTOGRAPHY W/O IMG CLINICAL DATA:  Fatty liver  EXAM: ULTRASOUND HEPATIC ELASTOGRAPHY  TECHNIQUE: Ultrasound elastography evaluation of the liver was performed. A region of interest was placed in the  right lobe of the liver. Following application of a compressive sonographic pulse, shear waves were detected in the adjacent hepatic tissue and the shear wave velocity was calculated. Multiple assessments were  performed at the selected site. Median shear wave velocity is correlated to a Metavir fibrosis score.  COMPARISON:  Abdominal ultrasound dated 12/22/2015  FINDINGS: Device: Siemens Helix VTQ  Patient position: Supine  Transducer 6C1  Number of measurements: 10  Hepatic segment:  8  Median velocity:   2.59  m/sec  IQR: 0.29  IQR/Median velocity ratio: 0.11  Corresponding Metavir fibrosis score:  Some F3 + F4  Risk of fibrosis: High  Limitations of exam: None  Pertinent findings noted on other imaging exams:  None  Please note that abnormal shear wave velocities may also be identified in clinical settings other than with hepatic fibrosis, such as: acute hepatitis, elevated right heart and central venous pressures including use of beta blockers, veno-occlusive disease (Budd-Chiari), infiltrative processes such as mastocytosis/amyloidosis/infiltrative tumor, extrahepatic cholestasis, in the post-prandial state, and liver transplantation. Correlation with patient history, laboratory data, and clinical condition recommended.  IMPRESSION: Median hepatic shear wave velocity is calculated at 2.59 m/sec.  Corresponding Metavir fibrosis score is  Some F3 + F4.  Risk of fibrosis is High.  Follow-up: Follow up advised  Electronically Signed   By: Julian Hy M.D.   On: 01/25/2016 10:51  Complexity Note: Imaging results reviewed. Results shared with Mr. Yohn, using Layman's terms.                         Meds   Current Outpatient Medications:  .  cyclobenzaprine (FLEXERIL) 10 MG tablet, Take 1 tablet (10 mg total) by mouth once. prn, Disp: 30 tablet, Rfl: 5 .  fluticasone (FLONASE) 50 MCG/ACT nasal spray, Place into both nostrils daily. prn, Disp: , Rfl:  .  gabapentin (NEURONTIN) 300 MG capsule, TAKE 1 CAPSULE BY MOUTH EVERY DAY AT NIGHT, Disp: , Rfl: 3 .  meloxicam (MOBIC) 15 MG tablet, Take 15 mg by mouth as needed. , Disp: , Rfl:  .  omeprazole (PRILOSEC)  40 MG capsule, Take 40 mg by mouth 3 (three) times daily., Disp: , Rfl:  .  pravastatin (PRAVACHOL) 10 MG tablet, Take 10 mg by mouth daily., Disp: , Rfl:  .  PREVIDENT 5000 SENSITIVE 1.1-5 % PSTE, Take 1 application by mouth daily., Disp: , Rfl: 0 .  traMADol (ULTRAM) 50 MG tablet, TAKE 1 TABLET BY MOUTH EVERY 6 HOURS AS NEEDED FOR MODERATE PAIN, Disp: 120 tablet, Rfl: 2 .  gabapentin (NEURONTIN) 300 MG capsule, Take 1 capsule (300 mg total) by mouth 3 (three) times daily., Disp: 90 capsule, Rfl: 2  ROS  Constitutional: Denies any fever or chills Gastrointestinal: No reported hemesis, hematochezia, vomiting, or acute GI distress Musculoskeletal: Denies any acute onset joint swelling, redness, loss of ROM, or weakness Neurological: No reported episodes of acute onset apraxia, aphasia, dysarthria, agnosia, amnesia, paralysis, loss of coordination, or loss of consciousness  Allergies  Mr. Franchini has No Known Allergies.  PFSH  Drug: Mr. Hippe  reports that he does not use drugs. Alcohol:  reports that he does not drink alcohol. Tobacco:  reports that he has never smoked. He has never used smokeless tobacco. Medical:  has a past medical history of Fatty liver, GERD (gastroesophageal reflux disease), Hypercholesteremia, and Tonsillitis. Surgical: Mr. Angert  has a past surgical history that includes Cervical spine surgery and Elbow surgery (Left). Family:  family history includes Diabetes in his mother; Heart disease in his father; Stroke in his father.  Constitutional Exam  General appearance: Well nourished, well developed, and well hydrated. In no apparent acute distress Vitals:   04/30/18 1127  BP: (!) 121/91  Pulse: 84  Resp: 16  Temp: 98.2 F (36.8 C)  TempSrc: Oral  SpO2: 98%  Weight: 226 lb (102.5 kg)  Height: 5' 8"  (1.727 m)   BMI Assessment: Estimated body mass index is 34.36 kg/m as calculated from the following:   Height as of this encounter: 5' 8"  (1.727 m).   Weight as  of this encounter: 226 lb (102.5 kg).  Psych/Mental status: Alert, oriented x 3 (person, place, & time)       Eyes: PERLA Respiratory: No evidence of acute respiratory distress  Cervical Spine Area Exam  Skin & Axial Inspection: Well healed scar from previous spine surgery detected Alignment: Symmetrical Functional ROM: Unrestricted ROM      Stability: No instability detected Muscle Tone/Strength: Functionally intact. No obvious neuro-muscular anomalies detected. Sensory (Neurological): Unimpaired Palpation: Uncomfortable              Upper Extremity (UE) Exam    Side: Right upper extremity  Side: Left upper extremity  Skin & Extremity Inspection: Skin color, temperature, and hair growth are WNL. No peripheral edema or cyanosis. No masses, redness, swelling, asymmetry, or associated skin lesions. No contractures.  Skin & Extremity Inspection: Skin color, temperature, and hair growth are WNL. No peripheral edema or cyanosis. No masses, redness, swelling, asymmetry, or associated skin lesions. No contractures.  Functional ROM: Unrestricted ROM          Functional ROM: Adequate ROM          Muscle Tone/Strength: Functionally intact. No obvious neuro-muscular anomalies detected.  Muscle Tone/Strength: Grossly intact  Sensory (Neurological): Unimpaired          Sensory (Neurological): Dermatomal pain pattern          Palpation: No palpable anomalies              Palpation: No palpable anomalies              Provocative Test(s):  Phalen's test: deferred Tinel's test: deferred Apley's scratch test (touch opposite shoulder):  Action 1 (Across chest): deferred Action 2 (Overhead): deferred Action 3 (LB reach): deferred   Provocative Test(s):  Phalen's test: deferred Tinel's test: deferred Apley's scratch test (touch opposite shoulder):  Action 1 (Across chest): deferred Action 2 (Overhead): deferred Action 3 (LB reach): deferred    Gait & Posture Assessment  Ambulation:  Unassisted Gait: Relatively normal for age and body habitus Posture: WNL   Assessment  Primary Diagnosis & Pertinent Problem List: The primary encounter diagnosis was Cervical radiculitis. Diagnoses of DDD (degenerative disc disease), cervical, Chronic neck pain, and Chronic pain syndrome were also pertinent to this visit.  Status Diagnosis  Persistent Persistent Having a Flare-up 1. Cervical radiculitis   2. DDD (degenerative disc disease), cervical   3. Chronic neck pain   4. Chronic pain syndrome     Problems updated and reviewed during this visit: No problems updated. Plan of Care  Pharmacotherapy (Medications Ordered): Meds ordered this encounter  Medications  . traMADol (ULTRAM) 50 MG tablet    Sig: TAKE 1 TABLET BY MOUTH EVERY 6 HOURS AS NEEDED FOR MODERATE PAIN    Dispense:  120 tablet    Refill:  2    Refill every 30 days  Order Specific Question:   Supervising Provider    Answer:   Milinda Pointer [id number]  . gabapentin (NEURONTIN) 300 MG capsule    Sig: Take 1 capsule (300 mg total) by mouth 3 (three) times daily.    Dispense:  90 capsule    Refill:  2    Do not place this medication, or any other prescription from our practice, on "Automatic Refill". Patient may have prescription filled one day early if pharmacy is closed on scheduled refill date.    Order Specific Question:   Supervising Provider    Answer:   Milinda Pointer [[id number]]   New Prescriptions   No medications on file   Medications administered today: [name] had no medications administered during this visit. Lab-work, procedure(s), and/or referral(s): Orders Placed This Encounter  Procedures  . DG Cervical Spine With Flex & Extend   Imaging and/or referral(s): DG CERVICAL SPINE WITH FLEX & EXTEND  Interventional therapies: Planned, scheduled, and/or pending:   Not at this time.  Urged patient to use gabapentin at least 1 tablet daily to help with neuropathic pain.  Encourage  patient to start using pre-analgesia with over-the-counter ibuprofen or Advil.  Provider-requested follow-up: Return in about 3 months (around 07/31/2018) for MedMgmt with Me Donella Stade [facility] Pace).  Future Appointments  Date Time Provider [place]  07/30/2018 11:00 AM Vevelyn Francois, NP [facility] None   Primary Care Physician: Dion Body, MD Location: [facility] Note by: Vevelyn Francois NP Date: 04/30/2018; Time: 3:56 PM  Pain Score Disclaimer: We use the NRS-11 scale. This is a self-reported, subjective measurement of pain severity with only modest accuracy. It is used primarily to identify changes within a particular patient. It must be understood that outpatient pain scales are significantly less accurate that those used for research, where they can be applied under ideal controlled circumstances with minimal exposure to variables. In reality, the score is likely to be a combination of pain intensity and pain affect, where pain affect describes the degree of emotional arousal or changes in action readiness caused by the sensory experience of pain. Factors such as social and work situation, setting, emotional state, anxiety levels, expectation, and prior pain experience may influence pain perception and show large inter-individual differences that may also be affected by time variables.  Patient instructions provided during this appointment: Patient Instructions  You have been given Rx for Tramadol with 2 refills to last until 07/29/2018.  Gabapentin has been escribed to your pharmacy.  You will have XRays done prior to next appt. with pain clinic.____________________________________________________________________________________________  Medication Rules  Applies to: All patients receiving prescriptions (written or electronic).  Pharmacy of record: Pharmacy where electronic prescriptions will be sent. If written prescriptions are taken to a  different pharmacy, please inform the nursing staff. The pharmacy listed in the electronic medical record should be the one where you would like electronic prescriptions to be sent.  Prescription refills: Only during scheduled appointments. Applies to both, written and electronic prescriptions.  NOTE: The following applies primarily to controlled substances (Opioid* Pain Medications).   Patient's responsibilities: 1. Pain Pills: Bring all pain pills to every appointment (except for procedure appointments). 2. Pill Bottles: Bring pills in original pharmacy bottle. Always bring newest bottle. Bring bottle, even if empty. 3. Medication refills: You are responsible for knowing and keeping track of what medications you need refilled. The day before your appointment, write a list of all prescriptions that need to be refilled. Bring that  list to your appointment and give it to the admitting nurse. Prescriptions will be written only during appointments. If you forget a medication, it will not be "Called in", "Faxed", or "electronically sent". You will need to get another appointment to get these prescribed. 4. Prescription Accuracy: You are responsible for carefully inspecting your prescriptions before leaving our office. Have the discharge nurse carefully go over each prescription with you, before taking them home. Make sure that your name is accurately spelled, that your address is correct. Check the name and dose of your medication to make sure it is accurate. Check the number of pills, and the written instructions to make sure they are clear and accurate. Make sure that you are given enough medication to last until your next medication refill appointment. 5. Taking Medication: Take medication as prescribed. Never take more pills than instructed. Never take medication more frequently than prescribed. Taking less pills or less frequently is permitted and encouraged, when it comes to controlled substances  (written prescriptions).  6. Inform other Doctors: Always inform, all of your healthcare providers, of all the medications you take. 7. Pain Medication from other Providers: You are not allowed to accept any additional pain medication from any other Doctor or Healthcare provider. There are two exceptions to this rule. (see below) In the event that you require additional pain medication, you are responsible for notifying us, as stated below. 8. Medication Agreement: You are responsible for carefully reading and following our Medication Agreement. This must be signed before receiving any prescriptions from our practice. Safely store a copy of your signed Agreement. Violations to the Agreement will result in no further prescriptions. (Additional copies of our Medication Agreement are available upon request.) 9. Laws, Rules, & Regulations: All patients are expected to follow all Federal and [employer], Rules, [employer]. Ignorance of the Laws does not constitute a valid excuse. The use of any illegal substances is prohibited. 10. Adopted CDC guidelines & recommendations: Target dosing levels will be at or below 60 MME/day. Use of benzodiazepines** is not recommended.  Exceptions: There are only two exceptions to the rule of not receiving pain medications from other Healthcare Providers. 1. Exception #1 (Emergencies): In the event of an emergency (i.e.: accident requiring emergency care), you are allowed to receive additional pain medication. However, you are responsible for: As soon as you are able, call our office [phone], at any time of the day or night, and leave a message stating your name, the date and nature of the emergency, and the name and dose of the medication prescribed. In the event that your call is answered by a member of our staff, make sure to document and save the date, time, and the name of the person that took your information.  2. Exception #2 (Planned Surgery): In the  event that you are scheduled by another doctor or dentist to have any type of surgery or procedure, you are allowed (for a period no longer than 30 days), to receive additional pain medication, for the acute post-op pain. However, in this case, you are responsible for picking up a copy of our "Post-op Pain Management for Surgeons" handout, and giving it to your surgeon or dentist. This document is available at our office, and does not require an appointment to obtain it. Simply go to our office during business hours (Monday-Thursday from 8:00 AM to 4:00 PM) (Friday 8:00 AM to 12:00 Noon) or if you have a scheduled appointment with [place], prior to  your surgery, and ask for it by name. In addition, you will need to provide [place] with your name, name of your surgeon, type of surgery, and date of procedure or surgery.  *Opioid medications include: morphine, codeine, oxycodone, oxymorphone, hydrocodone, hydromorphone, meperidine, tramadol, tapentadol, buprenorphine, fentanyl, methadone. **Benzodiazepine medications include: diazepam (Valium), alprazolam (Xanax), clonazepam (Klonopine), lorazepam (Ativan), clorazepate (Tranxene), chlordiazepoxide (Librium), estazolam (Prosom), oxazepam (Serax), temazepam (Restoril), triazolam (Halcion) (Last updated: 11/27/2017) ____________________________________________________________________________________________

## 2018-05-01 NOTE — Telephone Encounter (Signed)
Left message for patient to call us and let us know if he would like to pick his FMLA papers up or if he would like us to mail them.

## 2018-07-30 ENCOUNTER — Ambulatory Visit: Payer: Federal, State, Local not specified - PPO | Admitting: Nurse Practitioner

## 2018-08-13 ENCOUNTER — Encounter: Payer: Self-pay | Admitting: Nurse Practitioner

## 2018-08-13 ENCOUNTER — Ambulatory Visit: Payer: Federal, State, Local not specified - PPO | Attending: Nurse Practitioner | Admitting: Nurse Practitioner

## 2018-08-13 ENCOUNTER — Other Ambulatory Visit: Payer: Self-pay

## 2018-08-13 VITALS — BP 132/96 | HR 83 | Temp 98.4°F | Resp 16 | Ht 68.0 in | Wt 235.0 lb

## 2018-08-13 DIAGNOSIS — G894 Chronic pain syndrome: Secondary | ICD-10-CM | POA: Insufficient documentation

## 2018-08-13 DIAGNOSIS — M5010 Cervical disc disorder with radiculopathy, unspecified cervical region: Secondary | ICD-10-CM | POA: Diagnosis not present

## 2018-08-13 DIAGNOSIS — K219 Gastro-esophageal reflux disease without esophagitis: Secondary | ICD-10-CM | POA: Diagnosis not present

## 2018-08-13 DIAGNOSIS — M5030 Other cervical disc degeneration, unspecified cervical region: Secondary | ICD-10-CM | POA: Diagnosis not present

## 2018-08-13 DIAGNOSIS — Z791 Long term (current) use of non-steroidal anti-inflammatories (NSAID): Secondary | ICD-10-CM | POA: Insufficient documentation

## 2018-08-13 DIAGNOSIS — M542 Cervicalgia: Secondary | ICD-10-CM

## 2018-08-13 DIAGNOSIS — M5412 Radiculopathy, cervical region: Secondary | ICD-10-CM

## 2018-08-13 DIAGNOSIS — K760 Fatty (change of) liver, not elsewhere classified: Secondary | ICD-10-CM | POA: Insufficient documentation

## 2018-08-13 DIAGNOSIS — R7309 Other abnormal glucose: Secondary | ICD-10-CM | POA: Insufficient documentation

## 2018-08-13 DIAGNOSIS — E669 Obesity, unspecified: Secondary | ICD-10-CM | POA: Diagnosis not present

## 2018-08-13 DIAGNOSIS — E7800 Pure hypercholesterolemia, unspecified: Secondary | ICD-10-CM | POA: Insufficient documentation

## 2018-08-13 DIAGNOSIS — G8929 Other chronic pain: Secondary | ICD-10-CM

## 2018-08-13 DIAGNOSIS — Z79899 Other long term (current) drug therapy: Secondary | ICD-10-CM | POA: Diagnosis not present

## 2018-08-13 DIAGNOSIS — Z79891 Long term (current) use of opiate analgesic: Secondary | ICD-10-CM | POA: Insufficient documentation

## 2018-08-13 DIAGNOSIS — Z833 Family history of diabetes mellitus: Secondary | ICD-10-CM | POA: Insufficient documentation

## 2018-08-13 DIAGNOSIS — Z6835 Body mass index (BMI) 35.0-35.9, adult: Secondary | ICD-10-CM | POA: Diagnosis not present

## 2018-08-13 DIAGNOSIS — I10 Essential (primary) hypertension: Secondary | ICD-10-CM | POA: Insufficient documentation

## 2018-08-13 MED ORDER — TRAMADOL HCL 50 MG PO TABS: ORAL_TABLET | ORAL | 2 refills | Status: DC

## 2018-08-13 MED ORDER — GABAPENTIN 300 MG PO CAPS: 300.0000 mg | ORAL_CAPSULE | Freq: Three times a day (TID) | ORAL | 2 refills | Status: DC

## 2018-08-13 NOTE — Progress Notes (Signed)
Nursing Pain Medication Assessment:  Safety precautions to be maintained throughout the outpatient stay will include: orient to surroundings, keep bed in low position, maintain call bell within reach at all times, provide assistance with transfer out of bed and ambulation.  Medication Inspection Compliance: Pill count conducted under aseptic conditions, in front of the patient. Neither the pills nor the bottle was removed from the patient's sight at any time. Once count was completed pills were immediately returned to the patient in their original bottle.  Medication: Tramadol (Ultram) Pill/Patch Count: 45 of 120 pills remain Pill/Patch Appearance: Markings consistent with prescribed medication Bottle Appearance: Standard pharmacy container. Clearly labeled. Filled Date: 10 / 23 / 2019 Last Medication intake:  Today

## 2018-08-13 NOTE — Patient Instructions (Addendum)
____________________________________________________________________________________________  Medication Rules  Applies to: All patients receiving prescriptions (written or electronic).  Pharmacy of record: Pharmacy where electronic prescriptions will be sent. If written prescriptions are taken to a different pharmacy, please inform the nursing staff. The pharmacy listed in the electronic medical record should be the one where you would like electronic prescriptions to be sent.  Prescription refills: Only during scheduled appointments. Applies to both, written and electronic prescriptions.  NOTE: The following applies primarily to controlled substances (Opioid* Pain Medications).   Patient's responsibilities: 1. Pain Pills: Bring all pain pills to every appointment (except for procedure appointments). 2. Pill Bottles: Bring pills in original pharmacy bottle. Always bring newest bottle. Bring bottle, even if empty. 3. Medication refills: You are responsible for knowing and keeping track of what medications you need refilled. The day before your appointment, write a list of all prescriptions that need to be refilled. Bring that list to your appointment and give it to the admitting nurse. Prescriptions will be written only during appointments. If you forget a medication, it will not be "Called in", "Faxed", or "electronically sent". You will need to get another appointment to get these prescribed. 4. Prescription Accuracy: You are responsible for carefully inspecting your prescriptions before leaving our office. Have the discharge nurse carefully go over each prescription with you, before taking them home. Make sure that your name is accurately spelled, that your address is correct. Check the name and dose of your medication to make sure it is accurate. Check the number of pills, and the written instructions to make sure they are clear and accurate. Make sure that you are given enough medication to last  until your next medication refill appointment. 5. Taking Medication: Take medication as prescribed. Never take more pills than instructed. Never take medication more frequently than prescribed. Taking less pills or less frequently is permitted and encouraged, when it comes to controlled substances (written prescriptions).  6. Inform other Doctors: Always inform, all of your healthcare providers, of all the medications you take. 7. Pain Medication from other Providers: You are not allowed to accept any additional pain medication from any other Doctor or Healthcare provider. There are two exceptions to this rule. (see below) In the event that you require additional pain medication, you are responsible for notifying us, as stated below. 8. Medication Agreement: You are responsible for carefully reading and following our Medication Agreement. This must be signed before receiving any prescriptions from our practice. Safely store a copy of your signed Agreement. Violations to the Agreement will result in no further prescriptions. (Additional copies of our Medication Agreement are available upon request.) 9. Laws, Rules, & Regulations: All patients are expected to follow all Federal and State Laws, Statutes, Rules, & Regulations. Ignorance of the Laws does not constitute a valid excuse. The use of any illegal substances is prohibited. 10. Adopted CDC guidelines & recommendations: Target dosing levels will be at or below 60 MME/day. Use of benzodiazepines** is not recommended.  Exceptions: There are only two exceptions to the rule of not receiving pain medications from other Healthcare Providers. 1. Exception #1 (Emergencies): In the event of an emergency (i.e.: accident requiring emergency care), you are allowed to receive additional pain medication. However, you are responsible for: As soon as you are able, call our office [phone], at any time of the day or night, and leave a message stating your name, the  date and nature of the emergency, and the name and dose of the medication   prescribed. In the event that your call is answered by a member of our staff, make sure to document and save the date, time, and the name of the person that took your information.  2. Exception #2 (Planned Surgery): In the event that you are scheduled by another doctor or dentist to have any type of surgery or procedure, you are allowed (for a period no longer than 30 days), to receive additional pain medication, for the acute post-op pain. However, in this case, you are responsible for picking up a copy of our "Post-op Pain Management for Surgeons" handout, and giving it to your surgeon or dentist. This document is available at our office, and does not require an appointment to obtain it. Simply go to our office during business hours (Monday-Thursday from 8:00 AM to 4:00 PM) (Friday 8:00 AM to 12:00 Noon) or if you have a scheduled appointment with us, prior to your surgery, and ask for it by name. In addition, you will need to provide us with your name, name of your surgeon, type of surgery, and date of procedure or surgery.  *Opioid medications include: morphine, codeine, oxycodone, oxymorphone, hydrocodone, hydromorphone, meperidine, tramadol, tapentadol, buprenorphine, fentanyl, methadone. **Benzodiazepine medications include: diazepam (Valium), alprazolam (Xanax), clonazepam (Klonopine), lorazepam (Ativan), clorazepate (Tranxene), chlordiazepoxide (Librium), estazolam (Prosom), oxazepam (Serax), temazepam (Restoril), triazolam (Halcion) (Last updated: 11/27/2017) ____________________________________________________________________________________________    BMI Assessment: Estimated body mass index is 35.73 kg/m as calculated from the following:   Height as of this encounter: 5\' 8"  (1.727 m).   Weight as of this encounter: 235 lb (106.6 kg).  BMI interpretation table: BMI level Category Range association with higher  incidence of chronic pain  <18 kg/m2 Underweight   18.5-24.9 kg/m2 Ideal body weight   25-29.9 kg/m2 Overweight Increased incidence by 20%  30-34.9 kg/m2 Obese (Class I) Increased incidence by 68%  35-39.9 kg/m2 Severe obesity (Class II) Increased incidence by 136%  >40 kg/m2 Extreme obesity (Class III) Increased incidence by 254%   Patient's current BMI Ideal Body weight  Body mass index is 35.73 kg/m. Ideal body weight: 68.4 kg (150 lb 12.7 oz) Adjusted ideal body weight: 83.7 kg (184 lb 7.6 oz)   BMI Readings from Last 4 Encounters:  08/13/18 35.73 kg/m  04/30/18 34.36 kg/m  01/13/18 35.73 kg/m  08/27/17 33.15 kg/m   Wt Readings from Last 4 Encounters:  08/13/18 235 lb (106.6 kg)  04/30/18 226 lb (102.5 kg)  01/13/18 235 lb (106.6 kg)  08/27/17 218 lb (98.9 kg)

## 2018-08-13 NOTE — Progress Notes (Signed)
Patient's Name: Carlos Gentry  MRN: [id number]  Referring Provider: Dion Body, MD  DOB: 09-16-1966  PCP: Dion Body, MD  DOS: 08/13/2018  Note by: Vevelyn Francois NP  Service setting: Ambulatory outpatient  Specialty: Interventional Pain Management  Location: [facility] (AMB) Pain Management Facility    Patient type: Established    Primary Reason(s) for Visit: Encounter for prescription drug management. (Level of risk: moderate)  CC: Neck Pain  HPI  Carlos Gentry is a 52 y.o. year old, male patient, who comes today for a medication management evaluation. He has Borderline diabetes mellitus; Chronic neck pain; Benign essential hypertension; Obesity, unspecified; Pure hypercholesterolemia; Other specified counseling; Vaccine counseling; Long term current use of opiate analgesic; Chronic pain syndrome; DDD (degenerative disc disease), cervical; Tingling in extremities; and Cervical radiculitis on their problem list. His primarily concern today is the Neck Pain  Pain Assessment: Location: Mid   Radiating: denies Onset: More than a month ago Duration:   Quality: Dull, Constant, Aching Severity: 4 /10 (subjective, self-reported pain score)  Note: Reported level is compatible with observation.                          Effect on ADL:   Timing: Constant Modifying factors: Tramadol, rest BP: (!) 132/96  HR: 83  Carlos Gentry was last scheduled for an appointment on 04/30/2018 for medication management. During today's appointment we reviewed Carlos Gentry chronic pain status, as well as his outpatient medication regimen.  He admits that the numbness and tingling in his left arm has improved.  He has also discontinued the use of the gabapentin because he is no longer having this problem.  The patient  reports that he does not use drugs. His body mass index is 35.73 kg/m.  Further details on both, my assessment(s), as well as the proposed treatment plan, please see below.  Controlled Substance  Pharmacotherapy Assessment REMS (Risk Evaluation and Mitigation Strategy)  Analgesic:Tramadol 72m MME/day:230mday ShHart RochesterRN  08/13/2018  2:19 PM  Sign at close encounter Nursing Pain Medication Assessment:  Safety precautions to be maintained throughout the outpatient stay will include: orient to surroundings, keep bed in low position, maintain call bell within reach at all times, provide assistance with transfer out of bed and ambulation.  Medication Inspection Compliance: Pill count conducted under aseptic conditions, in front of the patient. Neither the pills nor the bottle was removed from the patient's sight at any time. Once count was completed pills were immediately returned to the patient in their original bottle.  Medication: Tramadol (Ultram) Pill/Patch Count: 45 of 120 pills remain Pill/Patch Appearance: Markings consistent with prescribed medication Bottle Appearance: Standard pharmacy container. Clearly labeled. Filled Date: 10 / 23 / 2019 Last Medication intake:  Today   Pharmacokinetics: Liberation and absorption (onset of action): WNL Distribution (time to peak effect): WNL Metabolism and excretion (duration of action): WNL         Pharmacodynamics: Desired effects: Analgesia: Carlos Gentry >50% benefit. Functional ability: Patient reports that medication allows him to accomplish basic ADLs Clinically meaningful improvement in function (CMIF): Sustained CMIF goals met Perceived effectiveness: Described as relatively effective, allowing for increase in activities of daily living (ADL) Undesirable effects: Side-effects or Adverse reactions: None reported Monitoring: Carlos Gentry: Online review of the past 1258-monthriod conducted. Compliant with practice rules and regulations Last UDS on record: Summary  Date Value Ref Range Status  01/13/2018 FINAL  Final  Comment:    ==================================================================== TOXASSURE  SELECT 13 (MW) ==================================================================== Test                             Result       Flag       Units Drug Present and Declared for Prescription Verification   Tramadol                       >1623        EXPECTED   ng/mg creat   O-Desmethyltramadol            >1623        EXPECTED   ng/mg creat   N-Desmethyltramadol            815          EXPECTED   ng/mg creat    Source of tramadol is a prescription medication.    O-desmethyltramadol and N-desmethyltramadol are expected    metabolites of tramadol. ==================================================================== Test                      Result    Flag   Units      Ref Range   Creatinine              308              mg/dL      >=20 ==================================================================== Declared Medications:  The flagging and interpretation on this report are based on the  following declared medications.  Unexpected results may arise from  inaccuracies in the declared medications.  **Note: The testing scope of this panel includes these medications:  Tramadol  **Note: The testing scope of this panel does not include following  reported medications:  Cyclobenzaprine  Fluticasone  Gabapentin  Meloxicam [  Omeprazole  Pravastatin  Supplement ==================================================================== For clinical consultation, please call [phone]. ====================================================================    UDS interpretation: Compliant          Medication Assessment Form: Reviewed. Patient indicates being compliant with therapy Treatment compliance: Compliant Risk Assessment Profile: Aberrant behavior: See prior evaluations. None observed or detected today Comorbid factors increasing risk of overdose: See prior notes. No additional risks detected today Opioid risk tool (ORT) (Total Score): 0 Personal History of Substance Abuse  (SUD-Substance use disorder):  Alcohol: Negative  Illegal Drugs: Negative  Rx Drugs: Negative  ORT Risk Level calculation: Low Risk Risk of substance use disorder (SUD): Low Opioid Risk Tool - 08/13/18 1425      Family History of Substance Abuse   Alcohol  Negative    Illegal Drugs  Negative    Rx Drugs  Negative      Personal History of Substance Abuse   Alcohol  Negative    Illegal Drugs  Negative    Rx Drugs  Negative      Age   Age between 44-45 years   No      History of Preadolescent Sexual Abuse   History of Preadolescent Sexual Abuse  Negative or Male      Psychological Disease   Psychological Disease  Negative    Depression  Negative      Total Score   Opioid Risk Tool Scoring  0    Opioid Risk Interpretation  Low Risk      ORT Scoring interpretation table:  Score <3 = Low Risk for SUD  Score between 4-7 = Moderate  Risk for SUD  Score >8 = High Risk for Opioid Abuse   Risk Mitigation Strategies:  Patient Counseling: Covered Patient-Prescriber Agreement (PPA): Present and active  Notification to other healthcare providers: Done  Pharmacologic Plan: No change in therapy, at this time.             Laboratory Chemistry  Inflammation Markers (CRP: Acute Phase) (ESR: Chronic Phase) No results found for: CRP, ESRSEDRATE, LATICACIDVEN                       Rheumatology Markers No results found for: RF, ANA, LABURIC, URICUR, LYMEIGGIGMAB, LYMEABIGMQN, HLAB27                      Renal Function Markers No results found for: BUN, CREATININE, BCR, GFRAA, GFRNONAA                           Hepatic Function Markers No results found for: AST, ALT, ALBUMIN, ALKPHOS, HCVAB, AMYLASE, LIPASE, AMMONIA                      Electrolytes No results found for: NA, K, CL, CALCIUM, MG, PHOS                      Neuropathy Markers No results found for: VITAMINB12, FOLATE, HGBA1C, HIV                      CNS Tests No results found for: COLORCSF, APPEARCSF, RBCCOUNTCSF,  WBCCSF, POLYSCSF, LYMPHSCSF, EOSCSF, PROTEINCSF, GLUCCSF, JCVIRUS, CSFOLI, IGGCSF                      Bone Pathology Markers No results found for: VD25OH, [id number], [id number], [id number], 25OHVITD1, 25OHVITD2, 25OHVITD3, TESTOFREE, TESTOSTERONE                       Coagulation Parameters No results found for: INR, LABPROT, APTT, PLT, DDIMER, LABHEMA                      Cardiovascular Markers No results found for: BNP, CKTOTAL, CKMB, TROPONINI, HGB, HCT                       CA Markers No results found for: CEA, CA125, LABCA2                      Note: Lab results reviewed.  Recent Diagnostic Imaging Results  US LIVER ELASTOGRAPHY W/O IMG CLINICAL DATA:  Fatty liver  EXAM: ULTRASOUND HEPATIC ELASTOGRAPHY  TECHNIQUE: Ultrasound elastography evaluation of the liver was performed. A region of interest was placed in the right lobe of the liver. Following application of a compressive sonographic pulse, shear waves were detected in the adjacent hepatic tissue and the shear wave velocity was calculated. Multiple assessments were performed at the selected site. Median shear wave velocity is correlated to a Metavir fibrosis score.  COMPARISON:  Abdominal ultrasound dated 12/22/2015  FINDINGS: Device: Siemens Helix VTQ  Patient position: Supine  Transducer 6C1  Number of measurements: 10  Hepatic segment:  8  Median velocity:   2.59  m/sec  IQR: 0.29  IQR/Median velocity ratio: 0.11  Corresponding Metavir fibrosis score:  Some F3 + F4  Risk of fibrosis: High  Limitations of exam: None  Pertinent  findings noted on other imaging exams:  None  Please note that abnormal shear wave velocities may also be identified in clinical settings other than with hepatic fibrosis, such as: acute hepatitis, elevated right heart and central venous pressures including use of beta blockers, veno-occlusive disease (Budd-Chiari), infiltrative processes such  as mastocytosis/amyloidosis/infiltrative tumor, extrahepatic cholestasis, in the post-prandial state, and liver transplantation. Correlation with patient history, laboratory data, and clinical condition recommended.  IMPRESSION: Median hepatic shear wave velocity is calculated at 2.59 m/sec.  Corresponding Metavir fibrosis score is  Some F3 + F4.  Risk of fibrosis is High.  Follow-up: Follow up advised  Electronically Signed   By: Julian Hy M.D.   On: 01/25/2016 10:51  Complexity Note: Imaging results reviewed. Results shared with Mr. Selsor, using Layman's terms.                         Meds   Current Outpatient Medications:  .  fluticasone (FLONASE) 50 MCG/ACT nasal spray, Place into both nostrils daily. prn, Disp: , Rfl:  .  gabapentin (NEURONTIN) 300 MG capsule, TAKE 1 CAPSULE BY MOUTH EVERY DAY AT NIGHT, Disp: , Rfl: 3 .  meloxicam (MOBIC) 15 MG tablet, Take 15 mg by mouth as needed. , Disp: , Rfl:  .  omeprazole (PRILOSEC) 40 MG capsule, Take 40 mg by mouth 3 (three) times daily., Disp: , Rfl:  .  pravastatin (PRAVACHOL) 10 MG tablet, Take 10 mg by mouth daily., Disp: , Rfl:  .  PREVIDENT 5000 SENSITIVE 1.1-5 % PSTE, Take 1 application by mouth daily., Disp: , Rfl: 0 .  traMADol (ULTRAM) 50 MG tablet, TAKE 1 TABLET BY MOUTH EVERY 6 HOURS AS NEEDED FOR MODERATE PAIN, Disp: 120 tablet, Rfl: 2 .  gabapentin (NEURONTIN) 300 MG capsule, Take 1 capsule (300 mg total) by mouth 3 (three) times daily., Disp: 90 capsule, Rfl: 2  ROS  Constitutional: Denies any fever or chills Gastrointestinal: No reported hemesis, hematochezia, vomiting, or acute GI distress Musculoskeletal: Denies any acute onset joint swelling, redness, loss of ROM, or weakness Neurological: No reported episodes of acute onset apraxia, aphasia, dysarthria, agnosia, amnesia, paralysis, loss of coordination, or loss of consciousness  Allergies  Mr. Labreck has No Known Allergies.  PFSH  Drug: Mr. Viney   reports that he does not use drugs. Alcohol:  reports that he does not drink alcohol. Tobacco:  reports that he has never smoked. He has never used smokeless tobacco. Medical:  has a past medical history of Fatty liver, GERD (gastroesophageal reflux disease), Hypercholesteremia, and Tonsillitis. Surgical: Mr. Vacha  has a past surgical history that includes Cervical spine surgery and Elbow surgery (Left). Family: family history includes Diabetes in his mother; Heart disease in his father; Stroke in his father.  Constitutional Exam  General appearance: Well nourished, well developed, and well hydrated. In no apparent acute distress Vitals:   08/13/18 1420  BP: (!) 132/96  Pulse: 83  Resp: 16  Temp: 98.4 F (36.9 C)  TempSrc: Oral  SpO2: 99%  Weight: 235 lb (106.6 kg)  Height: 5' 8"  (1.727 m)  Psych/Mental status: Alert, oriented x 3 (person, place, & time)       Eyes: PERLA Respiratory: No evidence of acute respiratory distress  Cervical Spine Area Exam  Skin & Axial Inspection: No masses, redness, edema, swelling, or associated skin lesions Alignment: Symmetrical Functional ROM: Unrestricted ROM      Stability: No instability detected Muscle Tone/Strength: Functionally intact.  No obvious neuro-muscular anomalies detected. Sensory (Neurological): Unimpaired Palpation: No palpable anomalies              Upper Extremity (UE) Exam    Side: Right upper extremity  Side: Left upper extremity  Skin & Extremity Inspection: Skin color, temperature, and hair growth are WNL. No peripheral edema or cyanosis. No masses, redness, swelling, asymmetry, or associated skin lesions. No contractures.  Skin & Extremity Inspection: Skin color, temperature, and hair growth are WNL. No peripheral edema or cyanosis. No masses, redness, swelling, asymmetry, or associated skin lesions. No contractures.  Functional ROM: Unrestricted ROM          Functional ROM: Unrestricted ROM          Muscle Tone/Strength:  Functionally intact. No obvious neuro-muscular anomalies detected.  Muscle Tone/Strength: Functionally intact. No obvious neuro-muscular anomalies detected.  Sensory (Neurological): Unimpaired          Sensory (Neurological): Unimpaired          Palpation: No palpable anomalies              Palpation: No palpable anomalies               Thoracic Spine Area Exam  Skin & Axial Inspection: No masses, redness, or swelling Alignment: Symmetrical Functional ROM: Unrestricted ROM Stability: No instability detected Muscle Tone/Strength: Functionally intact. No obvious neuro-muscular anomalies detected. Sensory (Neurological): Unimpaired Muscle strength & Tone: No palpable anomalies  Gait & Posture Assessment  Ambulation: Unassisted Gait: Relatively normal for age and body habitus Posture: WNL   Assessment  Primary Diagnosis & Pertinent Problem List: The primary encounter diagnosis was Cervical radiculitis. Diagnoses of Chronic neck pain, DDD (degenerative disc disease), cervical, and Long term current use of opiate analgesic were also pertinent to this visit.  Status Diagnosis  Controlled Controlled Controlled 1. Cervical radiculitis   2. Chronic neck pain   3. DDD (degenerative disc disease), cervical   4. Long term current use of opiate analgesic     Problems updated and reviewed during this visit: No problems updated. Plan of Care  Pharmacotherapy (Medications Ordered): Meds ordered this encounter  Medications  . gabapentin (NEURONTIN) 300 MG capsule    Sig: Take 1 capsule (300 mg total) by mouth 3 (three) times daily.    Dispense:  90 capsule    Refill:  2    Do not place this medication, or any other prescription from our practice, on "Automatic Refill". Patient may have prescription filled one day early if pharmacy is closed on scheduled refill date.    Order Specific Question:   Supervising Provider    Answer:   Milinda Pointer [phone]  . traMADol (ULTRAM) 50 MG tablet     Sig: TAKE 1 TABLET BY MOUTH EVERY 6 HOURS AS NEEDED FOR MODERATE PAIN    Dispense:  120 tablet    Refill:  2    Refill every 30 days    Order Specific Question:   Supervising Provider    AnswerMilinda Pointer [phone]   New Prescriptions   No medications on file   Medications administered today: [name] had no medications administered during this visit. Lab-work, procedure(s), and/or referral(s): Orders Placed This Encounter  Procedures  . ToxASSURE Select 13 (MW), Urine   Imaging and/or referral(s): None  Interventional therapies: Planned, scheduled, and/or pending:   Not at this time.  Instructed patient on the proper use of gabapentin.  He admits that he will restart it  if the numbness and tingling comes back.   Provider-requested follow-up: Return in about 3 months (around 11/13/2018) for MedMgmt.  Future Appointments  Date Time Provider [place]  11/12/2018  1:30 PM Vevelyn Francois, NP [facility] None   Primary Care Physician: Dion Body, MD Location: [facility] Note by: Vevelyn Francois NP Date: 08/13/2018; Time: 3:39 PM  Pain Score Disclaimer: We use the NRS-11 scale. This is a self-reported, subjective measurement of pain severity with only modest accuracy. It is used primarily to identify changes within a particular patient. It must be understood that outpatient pain scales are significantly less accurate that those used for research, where they can be applied under ideal controlled circumstances with minimal exposure to variables. In reality, the score is likely to be a combination of pain intensity and pain affect, where pain affect describes the degree of emotional arousal or changes in action readiness caused by the sensory experience of pain. Factors such as social and work situation, setting, emotional state, anxiety levels, expectation, and prior pain experience may influence pain perception and show large  inter-individual differences that may also be affected by time variables.  Patient instructions provided during this appointment: Patient Instructions   ____________________________________________________________________________________________  Medication Rules  Applies to: All patients receiving prescriptions (written or electronic).  Pharmacy of record: Pharmacy where electronic prescriptions will be sent. If written prescriptions are taken to a different pharmacy, please inform the nursing staff. The pharmacy listed in the electronic medical record should be the one where you would like electronic prescriptions to be sent.  Prescription refills: Only during scheduled appointments. Applies to both, written and electronic prescriptions.  NOTE: The following applies primarily to controlled substances (Opioid* Pain Medications).   Patient's responsibilities: 1. Pain Pills: Bring all pain pills to every appointment (except for procedure appointments). 2. Pill Bottles: Bring pills in original pharmacy bottle. Always bring newest bottle. Bring bottle, even if empty. 3. Medication refills: You are responsible for knowing and keeping track of what medications you need refilled. The day before your appointment, write a list of all prescriptions that need to be refilled. Bring that list to your appointment and give it to the admitting nurse. Prescriptions will be written only during appointments. If you forget a medication, it will not be "Called in", "Faxed", or "electronically sent". You will need to get another appointment to get these prescribed. 4. Prescription Accuracy: You are responsible for carefully inspecting your prescriptions before leaving our office. Have the discharge nurse carefully go over each prescription with you, before taking them home. Make sure that your name is accurately spelled, that your address is correct. Check the name and dose of your medication to make sure it is  accurate. Check the number of pills, and the written instructions to make sure they are clear and accurate. Make sure that you are given enough medication to last until your next medication refill appointment. 5. Taking Medication: Take medication as prescribed. Never take more pills than instructed. Never take medication more frequently than prescribed. Taking less pills or less frequently is permitted and encouraged, when it comes to controlled substances (written prescriptions).  6. Inform other Doctors: Always inform, all of your healthcare providers, of all the medications you take. 7. Pain Medication from other Providers: You are not allowed to accept any additional pain medication from any other Doctor or Healthcare provider. There are two exceptions to this rule. (see below) In the event that you require additional pain  medication, you are responsible for notifying us, as stated below. 8. Medication Agreement: You are responsible for carefully reading and following our Medication Agreement. This must be signed before receiving any prescriptions from our practice. Safely store a copy of your signed Agreement. Violations to the Agreement will result in no further prescriptions. (Additional copies of our Medication Agreement are available upon request.) 9. Laws, Rules, & Regulations: All patients are expected to follow all Federal and [employer], [employer], Rules, [employer]. Ignorance of the Laws does not constitute a valid excuse. The use of any illegal substances is prohibited. 10. Adopted CDC guidelines & recommendations: Target dosing levels will be at or below 60 MME/day. Use of benzodiazepines** is not recommended.  Exceptions: There are only two exceptions to the rule of not receiving pain medications from other Healthcare Providers. 1. Exception #1 (Emergencies): In the event of an emergency (i.e.: accident requiring emergency care), you are allowed to receive additional pain medication.  However, you are responsible for: As soon as you are able, call our office [phone], at any time of the day or night, and leave a message stating your name, the date and nature of the emergency, and the name and dose of the medication prescribed. In the event that your call is answered by a member of our staff, make sure to document and save the date, time, and the name of the person that took your information.  2. Exception #2 (Planned Surgery): In the event that you are scheduled by another doctor or dentist to have any type of surgery or procedure, you are allowed (for a period no longer than 30 days), to receive additional pain medication, for the acute post-op pain. However, in this case, you are responsible for picking up a copy of our "Post-op Pain Management for Surgeons" handout, and giving it to your surgeon or dentist. This document is available at our office, and does not require an appointment to obtain it. Simply go to our office during business hours (Monday-Thursday from 8:00 AM to 4:00 PM) (Friday 8:00 AM to 12:00 Noon) or if you have a scheduled appointment with [place], prior to your surgery, and ask for it by name. In addition, you will need to provide [place] with your name, name of your surgeon, type of surgery, and date of procedure or surgery.  *Opioid medications include: morphine, codeine, oxycodone, oxymorphone, hydrocodone, hydromorphone, meperidine, tramadol, tapentadol, buprenorphine, fentanyl, methadone. **Benzodiazepine medications include: diazepam (Valium), alprazolam (Xanax), clonazepam (Klonopine), lorazepam (Ativan), clorazepate (Tranxene), chlordiazepoxide (Librium), estazolam (Prosom), oxazepam (Serax), temazepam (Restoril), triazolam (Halcion) (Last updated: 11/27/2017) ____________________________________________________________________________________________    BMI Assessment: Estimated body mass index is 35.73 kg/m as calculated from the following:   Height as of  this encounter: 5' 8"  (1.727 m).   Weight as of this encounter: 235 lb (106.6 kg).  BMI interpretation table: BMI level Category Range association with higher incidence of chronic pain  <18 kg/m2 Underweight   18.5-24.9 kg/m2 Ideal body weight   25-29.9 kg/m2 Overweight Increased incidence by 20%  30-34.9 kg/m2 Obese (Class I) Increased incidence by 68%  35-39.9 kg/m2 Severe obesity (Class II) Increased incidence by 136%  >40 kg/m2 Extreme obesity (Class III) Increased incidence by 254%   Patient's current BMI Ideal Body weight  Body mass index is 35.73 kg/m. Ideal body weight: 68.4 kg (150 lb 12.7 oz) Adjusted ideal body weight: 83.7 kg (184 lb 7.6 oz)   BMI Readings from Last 4 Encounters:  08/13/18 35.73 kg/m  04/30/18 34.36  kg/m  01/13/18 35.73 kg/m  08/27/17 33.15 kg/m   Wt Readings from Last 4 Encounters:  08/13/18 235 lb (106.6 kg)  04/30/18 226 lb (102.5 kg)  01/13/18 235 lb (106.6 kg)  08/27/17 218 lb (98.9 kg)

## 2018-08-19 LAB — TOXASSURE SELECT 13 (MW), URINE

## 2018-11-12 ENCOUNTER — Encounter: Payer: Federal, State, Local not specified - PPO | Admitting: Nurse Practitioner

## 2018-11-19 ENCOUNTER — Other Ambulatory Visit: Payer: Self-pay | Admitting: Nurse Practitioner

## 2018-11-19 ENCOUNTER — Encounter: Payer: Self-pay | Admitting: Nurse Practitioner

## 2018-11-19 ENCOUNTER — Telehealth: Payer: Self-pay

## 2018-11-19 ENCOUNTER — Other Ambulatory Visit: Payer: Self-pay

## 2018-11-19 ENCOUNTER — Ambulatory Visit: Payer: 59 | Attending: Nurse Practitioner | Admitting: Nurse Practitioner

## 2018-11-19 VITALS — BP 137/90 | HR 90 | Temp 98.8°F | Ht 68.0 in | Wt 235.0 lb

## 2018-11-19 DIAGNOSIS — G8929 Other chronic pain: Secondary | ICD-10-CM

## 2018-11-19 DIAGNOSIS — G894 Chronic pain syndrome: Secondary | ICD-10-CM

## 2018-11-19 DIAGNOSIS — M542 Cervicalgia: Secondary | ICD-10-CM

## 2018-11-19 DIAGNOSIS — M5412 Radiculopathy, cervical region: Secondary | ICD-10-CM | POA: Diagnosis not present

## 2018-11-19 DIAGNOSIS — M5030 Other cervical disc degeneration, unspecified cervical region: Secondary | ICD-10-CM

## 2018-11-19 DIAGNOSIS — Z79891 Long term (current) use of opiate analgesic: Secondary | ICD-10-CM

## 2018-11-19 MED ORDER — GABAPENTIN 300 MG PO CAPS: 300.0000 mg | ORAL_CAPSULE | Freq: Three times a day (TID) | ORAL | 2 refills | Status: DC

## 2018-11-19 MED ORDER — TRAMADOL HCL 50 MG PO TABS: ORAL_TABLET | ORAL | 2 refills | Status: DC

## 2018-11-19 NOTE — Progress Notes (Signed)
Nursing Pain Medication Assessment:  Safety precautions to be maintained throughout the outpatient stay will include: orient to surroundings, keep bed in low position, maintain call bell within reach at all times, provide assistance with transfer out of bed and ambulation.  Medication Inspection Compliance: Pill count conducted under aseptic conditions, in front of the patient. Neither the pills nor the bottle was removed from the patient's sight at any time. Once count was completed pills were immediately returned to the patient in their original bottle.  Medication: Tramadol (Ultram) Pill/Patch Count: 26 of 36 pills remain Pill/Patch Appearance: Markings consistent with prescribed medication Bottle Appearance: Standard pharmacy container. Clearly labeled. Filled Date: 2 / 65 / 2020 Last Medication intake:  Today

## 2018-11-19 NOTE — Patient Instructions (Signed)
____________________________________________________________________________________________  Medication Rules  Purpose: To inform patients, and their family members, of our rules and regulations.  Applies to: All patients receiving prescriptions (written or electronic).  Pharmacy of record: Pharmacy where electronic prescriptions will be sent. If written prescriptions are taken to a different pharmacy, please inform the nursing staff. The pharmacy listed in the electronic medical record should be the one where you would like electronic prescriptions to be sent.  Electronic prescriptions: In compliance with the [place] [employer]reng[place] Opioid Misuse Prevention (STOP) Act of 2017 (Session Law 2017-74/H243), effective September 30, 2018, all controlled substances must be electronically prescribed. Calling prescriptions to the pharmacy will cease to exist.  Prescription refills: Only during scheduled appointments. Applies to all prescriptions.  NOTE: The following applies primarily to controlled substances (Opioid* Pain Medications).   Patient's responsibilities: 1. Pain Pills: Bring all pain pills to every appointment (except for procedure appointments). 2. Pill Bottles: Bring pills in original pharmacy bottle. Always bring the newest bottle. Bring bottle, even if empty. 3. Medication refills: You are responsible for knowing and keeping track of what medications you take and those you need refilled. The day before your appointment: write a list of all prescriptions that need to be refilled. The day of the appointment: give the list to the admitting nurse. Prescriptions will be written only during appointments. No prescriptions will be written on procedure days. If you forget a medication: it will not be "Called in", "Faxed", or "electronically sent". You will need to get another appointment to get these prescribed. No early refills. Do not call asking to have your prescription filled  early. 4. Prescription Accuracy: You are responsible for carefully inspecting your prescriptions before leaving our office. Have the discharge nurse carefully go over each prescription with you, before taking them home. Make sure that your name is accurately spelled, that your address is correct. Check the name and dose of your medication to make sure it is accurate. Check the number of pills, and the written instructions to make sure they are clear and accurate. Make sure that you are given enough medication to last until your next medication refill appointment. 5. Taking Medication: Take medication as prescribed. When it comes to controlled substances, taking less pills or less frequently than prescribed is permitted and encouraged. Never take more pills than instructed. Never take medication more frequently than prescribed.  6. Inform other Doctors: Always inform, all of your healthcare providers, of all the medications you take. 7. Pain Medication from other Providers: You are not allowed to accept any additional pain medication from any other Doctor or Healthcare provider. There are two exceptions to this rule. (see below) In the event that you require additional pain medication, you are responsible for notifying us, as stated below. 8. Medication Agreement: You are responsible for carefully reading and following our Medication Agreement. This must be signed before receiving any prescriptions from our practice. Safely store a copy of your signed Agreement. Violations to the Agreement will result in no further prescriptions. (Additional copies of our Medication Agreement are available upon request.) 9. Laws, Rules, & Regulations: All patients are expected to follow all Federal and State Laws, Statutes, Rules, & Regulations. Ignorance of the Laws does not constitute a valid excuse. The use of any illegal substances is prohibited. 10. Adopted CDC guidelines & recommendations: Target dosing levels will be  at or below 60 MME/day. Use of benzodiazepines** is not recommended.  Exceptions: There are only two exceptions to the rule of not   receiving pain medications from other Healthcare Providers. 1. Exception #1 (Emergencies): In the event of an emergency (i.e.: accident requiring emergency care), you are allowed to receive additional pain medication. However, you are responsible for: As soon as you are able, call our office [phone], at any time of the day or night, and leave a message stating your name, the date and nature of the emergency, and the name and dose of the medication prescribed. In the event that your call is answered by a member of our staff, make sure to document and save the date, time, and the name of the person that took your information.  2. Exception #2 (Planned Surgery): In the event that you are scheduled by another doctor or dentist to have any type of surgery or procedure, you are allowed (for a period no longer than 30 days), to receive additional pain medication, for the acute post-op pain. However, in this case, you are responsible for picking up a copy of our "Post-op Pain Management for Surgeons" handout, and giving it to your surgeon or dentist. This document is available at our office, and does not require an appointment to obtain it. Simply go to our office during business hours (Monday-Thursday from 8:00 AM to 4:00 PM) (Friday 8:00 AM to 12:00 Noon) or if you have a scheduled appointment with us, prior to your surgery, and ask for it by name. In addition, you will need to provide us with your name, name of your surgeon, type of surgery, and date of procedure or surgery.  *Opioid medications include: morphine, codeine, oxycodone, oxymorphone, hydrocodone, hydromorphone, meperidine, tramadol, tapentadol, buprenorphine, fentanyl, methadone. **Benzodiazepine medications include: diazepam (Valium), alprazolam (Xanax), clonazepam (Klonopine), lorazepam (Ativan), clorazepate  (Tranxene), chlordiazepoxide (Librium), estazolam (Prosom), oxazepam (Serax), temazepam (Restoril), triazolam (Halcion) (Last updated: 11/27/2017) ____________________________________________________________________________________________    

## 2018-11-19 NOTE — Progress Notes (Signed)
Patient's Name: Carlos Gentry  MRN: [id number]  Referring Provider: Dion Body, MD  DOB: August 31, 1966  PCP: Dion Body, MD  DOS: 11/19/2018  Note by: Dionisio David, NP  Service setting: Ambulatory outpatient  Specialty: Interventional Pain Management  Location: [facility] (AMB) Pain Management Facility    Patient type: Established   HPI  Reason for Visit: Carlos Gentry is a 53 y.o. year old, male patient, who comes today with a chief complaint of Neck Pain Last Appointment: His last appointment at our practice was on 08/13/2018. I last saw him on 08/13/2018.  Pain Assessment: Today, Carlos Gentry describes the severity of the Chronic pain as a 3 /10. He indicates the location/referral of the pain to be Neck Mid/Denies. Onset was: More than a month ago. The quality of pain is described as Aching, Sharp. Temporal description, or timing of pain is: Constant. Possible modifying factors: medications. Carlos Gentry  height is 5' 8"  (1.727 m) and weight is 235 lb (106.6 kg). His temperature is 98.8 F (37.1 C). His blood pressure is 137/90 and his pulse is 90. His oxygen saturation is 99%.  He admits is doing well. He denies any concerns.   Controlled Substance Pharmacotherapy Assessment REMS (Risk Evaluation and Mitigation Strategy)  Analgesic:Tramadol 95m MME/day:262mday BrChauncey FischerRN  11/19/2018 10:00 AM  Sign when Signing Visit Nursing Pain Medication Assessment:  Safety precautions to be maintained throughout the outpatient stay will include: orient to surroundings, keep bed in low position, maintain call bell within reach at all times, provide assistance with transfer out of bed and ambulation.  Medication Inspection Compliance: Pill count conducted under aseptic conditions, in front of the patient. Neither the pills nor the bottle was removed from the patient's sight at any time. Once count was completed pills were immediately returned to the patient in their original  bottle.  Medication: Tramadol (Ultram) Pill/Patch Count: 26 of 36 pills remain Pill/Patch Appearance: Markings consistent with prescribed medication Bottle Appearance: Standard pharmacy container. Clearly labeled. Filled Date: 2 / 1347 2020 Last Medication intake:  Today   Pharmacokinetics: Liberation and absorption (onset of action): WNL Distribution (time to peak effect): WNL Metabolism and excretion (duration of action): WNL         Pharmacodynamics: Desired effects: Analgesia: Carlos Gentry >50% benefit. Functional ability: Patient reports that medication allows him to accomplish basic ADLs Clinically meaningful improvement in function (CMIF): Sustained CMIF goals met Perceived effectiveness: Described as relatively effective, allowing for increase in activities of daily living (ADL) Undesirable effects: Side-effects or Adverse reactions: None reported Monitoring: [place] PMP: Online review of the past 1237-monthriod conducted. Compliant with practice rules and regulations Last UDS on record: Summary  Date Value Ref Range Status  08/13/2018 FINAL  Final    Comment:    ==================================================================== TOXASSURE SELECT 13 (MW) ==================================================================== Test                             Result       Flag       Units Drug Present and Declared for Prescription Verification   Tramadol                       >4587        EXPECTED   ng/mg creat   O-Desmethyltramadol            >4587        EXPECTED  ng/mg creat   N-Desmethyltramadol            2911         EXPECTED   ng/mg creat    Source of tramadol is a prescription medication.    O-desmethyltramadol and N-desmethyltramadol are expected    metabolites of tramadol. ==================================================================== Test                      Result    Flag   Units      Ref Range   Creatinine              109              mg/dL       >=20 ==================================================================== Declared Medications:  The flagging and interpretation on this report are based on the  following declared medications.  Unexpected results may arise from  inaccuracies in the declared medications.  **Note: The testing scope of this panel includes these medications:  Tramadol  **Note: The testing scope of this panel does not include following  reported medications:  Fluoride (Prevident)  Gabapentin  Meloxicam  Omeprazole  Pravastatin ==================================================================== For clinical consultation, please call [phone]. ====================================================================    UDS interpretation: Compliant          Medication Assessment Form: Reviewed. Patient indicates being compliant with therapy Treatment compliance: Compliant Risk Assessment Profile: Aberrant behavior: See initial evaluations. None observed or detected today Comorbid factors increasing risk of overdose: See initial evaluation. No additional risks detected today Opioid risk tool (ORT):  Opioid Risk  11/19/2018  Alcohol 0  Illegal Drugs 0  Rx Drugs 0  Alcohol 0  Illegal Drugs 0  Rx Drugs 0  Age between 16-45 years  0  History of Preadolescent Sexual Abuse 0  Psychological Disease 0  Depression 0  Opioid Risk Tool Scoring 0  Opioid Risk Interpretation Low Risk    ORT Scoring interpretation table:  Score <3 = Low Risk for SUD  Score between 4-7 = Moderate Risk for SUD  Score >8 = High Risk for Opioid Abuse   Risk of substance use disorder (SUD): Low  Risk Mitigation Strategies:  Patient Counseling: Covered Patient-Prescriber Agreement (PPA): Present and active  Notification to other healthcare providers: Done  Pharmacologic Plan: No change in therapy, at this time.            ROS  Constitutional: Denies any fever or chills Gastrointestinal: No reported hemesis,  hematochezia, vomiting, or acute GI distress Musculoskeletal: Denies any acute onset joint swelling, redness, loss of ROM, or weakness Neurological: No reported episodes of acute onset apraxia, aphasia, dysarthria, agnosia, amnesia, paralysis, loss of coordination, or loss of consciousness  Medication Review  Sod Fluoride-Potassium Nitrate, fluticasone, gabapentin, meloxicam, omeprazole, pravastatin, and traMADol  History Review  Allergy: Carlos Gentry has No Known Allergies. Drug: Carlos Gentry  reports no history of drug use. Alcohol:  reports no history of alcohol use. Tobacco:  reports that he has never smoked. He has never used smokeless tobacco. Social: Carlos Gentry  reports that he has never smoked. He has never used smokeless tobacco. He reports that he does not drink alcohol or use drugs. Medical:  has a past medical history of Fatty liver, GERD (gastroesophageal reflux disease), Hypercholesteremia, and Tonsillitis. Surgical: Carlos Gentry  has a past surgical history that includes Cervical spine surgery and Elbow surgery (Left). Family: family history includes Diabetes in his mother; Heart disease in his father; Stroke in his father.  Problem List: Carlos Gentry has Chronic neck pain; Chronic pain syndrome; DDD (degenerative disc disease), cervical; and Cervical radiculitis on their pertinent problem list.  Lab Review  Kidney Function No results found for: BUN, CREATININE, BCR, GFRAA, GFRNONAALiver Function No results found for: AST, ALT, ALBUMINNote: Above Lab results reviewed.  Imaging Review  US LIVER ELASTOGRAPHY W/O IMG CLINICAL DATA:  Fatty liver  EXAM: ULTRASOUND HEPATIC ELASTOGRAPHY  TECHNIQUE: Ultrasound elastography evaluation of the liver was performed. A region of interest was placed in the right lobe of the liver. Following application of a compressive sonographic pulse, shear waves were detected in the adjacent hepatic tissue and the shear wave velocity was calculated. Multiple  assessments were performed at the selected site. Median shear wave velocity is correlated to a Metavir fibrosis score.  COMPARISON:  Abdominal ultrasound dated 12/22/2015  FINDINGS: Device: Siemens Helix VTQ  Patient position: Supine  Transducer 6C1  Number of measurements: 10  Hepatic segment:  8  Median velocity:   2.59  m/sec  IQR: 0.29  IQR/Median velocity ratio: 0.11  Corresponding Metavir fibrosis score:  Some F3 + F4  Risk of fibrosis: High  Limitations of exam: None  Pertinent findings noted on other imaging exams:  None  Please note that abnormal shear wave velocities may also be identified in clinical settings other than with hepatic fibrosis, such as: acute hepatitis, elevated right heart and central venous pressures including use of beta blockers, veno-occlusive disease (Budd-Chiari), infiltrative processes such as mastocytosis/amyloidosis/infiltrative tumor, extrahepatic cholestasis, in the post-prandial state, and liver transplantation. Correlation with patient history, laboratory data, and clinical condition recommended.  IMPRESSION: Median hepatic shear wave velocity is calculated at 2.59 m/sec.  Corresponding Metavir fibrosis score is  Some F3 + F4.  Risk of fibrosis is High.  Follow-up: Follow up advised  Electronically Signed   By: Julian Hy M.D.   On: 01/25/2016 10:51 Note: Above imaging results reviewed.        Physical Exam  General appearance: Well nourished, well developed, and well hydrated. In no apparent acute distress Mental status: Alert, oriented x 3 (person, place, & time)       Respiratory: No evidence of acute respiratory distress Eyes: PERLA Vitals: BP 137/90   Pulse 90   Temp 98.8 F (37.1 C)   Ht 5' 8"  (1.727 m)   Wt 235 lb (106.6 kg)   SpO2 99%   BMI 35.73 kg/m  BMI: Estimated body mass index is 35.73 kg/m as calculated from the following:   Height as of this encounter: 5' 8"  (1.727 m).   Weight as of  this encounter: 235 lb (106.6 kg). Ideal: Ideal body weight: 68.4 kg (150 lb 12.7 oz) Adjusted ideal body weight: 83.7 kg (184 lb 7.6 oz)   Cervical Spine Area Exam  Skin & Axial Inspection: Well healed scar from previous spine surgery detected Alignment: Symmetrical Functional ROM: Unrestricted ROM      Stability: No instability detected Muscle Tone/Strength: Functionally intact. No obvious neuro-muscular anomalies detected. Sensory (Neurological): Unimpaired Palpation: No palpable anomalies               Assessment   Status Diagnosis  Controlled Controlled Controlled 1. Cervical radiculitis   2. DDD (degenerative disc disease), cervical   3. Chronic neck pain   4. Chronic pain syndrome   5. Long term current use of opiate analgesic      Updated Problems: No problems updated.  Plan of Care  Pharmacotherapy (Medications Ordered): Meds ordered this  encounter  Medications  . traMADol (ULTRAM) 50 MG tablet    Sig: TAKE 1 TABLET BY MOUTH EVERY 6 HOURS AS NEEDED FOR MODERATE PAIN    Dispense:  120 tablet    Refill:  2    Refill every 30 days    Order Specific Question:   Supervising Provider    Answer:   Molli Barrows [[id number]]  . gabapentin (NEURONTIN) 300 MG capsule    Sig: Take 1 capsule (300 mg total) by mouth 3 (three) times daily.    Dispense:  90 capsule    Refill:  2    Do not place this medication, or any other prescription from our practice, on "Automatic Refill". Patient may have prescription filled one day early if pharmacy is closed on scheduled refill date.    Order Specific Question:   Supervising Provider    Answer:   Theressa Stamps   Administered today: [name] had no medications administered during this visit.  Orders:  No orders of the defined types were placed in this encounter.  Interventional options: Planned follow-up:   Not at this time.   Plan: Return in about 3 months (around 02/17/2019) for MedMgmt.   Note by: Dionisio David,  NP Date: 11/19/2018; Time: 10:58 AM

## 2018-11-23 NOTE — Telephone Encounter (Signed)
Pt was called, Tramadol refilled was sent to pharmacy and can be pick up today.

## 2019-02-18 ENCOUNTER — Ambulatory Visit: Payer: 59 | Attending: Nurse Practitioner | Admitting: Nurse Practitioner

## 2019-02-18 ENCOUNTER — Other Ambulatory Visit: Payer: Self-pay

## 2019-02-18 NOTE — Progress Notes (Signed)
Pain Management Encounter Note - Virtual Visit via Telephone Telehealth (real-time audio visits between healthcare provider and patient).  Patient's Phone No. & Preferred Pharmacy:  [phone] (home); [phone] (mobile); (Preferred) [phone]  [facility] [phone] [address], [facility] - [address] [place] [address] Phone: [phone] Fax: [phone]   Pre-screening note:  Our staff contacted [name] and offered him an "in person", "face-to-face" appointment versus a telephone encounter. He indicated preferring the telephone encounter, at this time.  Reason for Virtual Visit: COVID-19*  Social distancing based on CDC and AMA recommendations.   I initiated contact with Carlos Gentry on 02/18/2019 at 11:27 AM by telephone, but I got no answer. I left a message to reschedule the call.   Note by: Thad Ranger, NP Date: 02/18/2019; Time: 11:27 AM  Disclaimer:  * Given the special circumstances of the COVID-19 pandemic, the federal government has announced that the Office for Civil Rights (OCR) will exercise its enforcement discretion and will not impose penalties on physicians using telehealth in the event of noncompliance with regulatory requirements under the [employer] Portability and Accountability Act (HIPAA) in connection with the good faith provision of telehealth during the COVID-19 national public health emergency. (AMA)

## 2019-06-18 ENCOUNTER — Other Ambulatory Visit: Payer: Self-pay

## 2019-06-18 ENCOUNTER — Emergency Department
Admission: EM | Admit: 2019-06-18 | Discharge: 2019-06-18 | Disposition: A | Discharge status: Home / Self Care | Payer: 59 | Attending: Emergency Medicine | Admitting: Emergency Medicine

## 2019-06-18 ENCOUNTER — Emergency Department: Payer: 59

## 2019-06-18 DIAGNOSIS — R079 Chest pain, unspecified: Secondary | ICD-10-CM | POA: Diagnosis present

## 2019-06-18 DIAGNOSIS — Z20828 Contact with and (suspected) exposure to other viral communicable diseases: Secondary | ICD-10-CM | POA: Diagnosis not present

## 2019-06-18 DIAGNOSIS — R0789 Other chest pain: Secondary | ICD-10-CM | POA: Insufficient documentation

## 2019-06-18 DIAGNOSIS — Z79899 Other long term (current) drug therapy: Secondary | ICD-10-CM | POA: Insufficient documentation

## 2019-06-18 LAB — CBC WITH DIFFERENTIAL/PLATELET
Abs Immature Granulocytes: 0 10*3/uL (ref 0.00–0.07)
Basophils Absolute: 0 10*3/uL (ref 0.0–0.1)
Basophils Relative: 1 %
Eosinophils Absolute: 0.1 10*3/uL (ref 0.0–0.5)
Eosinophils Relative: 4 %
HCT: 42.8 % (ref 39.0–52.0)
Hemoglobin: 14.1 g/dL (ref 13.0–17.0)
Immature Granulocytes: 0 %
Lymphocytes Relative: 51 %
Lymphs Abs: 1.7 10*3/uL (ref 0.7–4.0)
MCH: 28.8 pg (ref 26.0–34.0)
MCHC: 32.9 g/dL (ref 30.0–36.0)
MCV: 87.5 fL (ref 80.0–100.0)
Monocytes Absolute: 0.3 10*3/uL (ref 0.1–1.0)
Monocytes Relative: 9 %
Neutro Abs: 1.2 10*3/uL — ABNORMAL LOW (ref 1.7–7.7)
Neutrophils Relative %: 35 %
Platelets: 234 10*3/uL (ref 150–400)
RBC: 4.89 MIL/uL (ref 4.22–5.81)
RDW: 12.8 % (ref 11.5–15.5)
WBC: 3.3 10*3/uL — ABNORMAL LOW (ref 4.0–10.5)
nRBC: 0 % (ref 0.0–0.2)

## 2019-06-18 LAB — BASIC METABOLIC PANEL
Anion gap: 8 (ref 5–15)
BUN: 14 mg/dL (ref 6–20)
CO2: 24 mmol/L (ref 22–32)
Calcium: 10.1 mg/dL (ref 8.9–10.3)
Chloride: 108 mmol/L (ref 98–111)
Creatinine, Ser: 0.98 mg/dL (ref 0.61–1.24)
GFR calc Af Amer: >60 mL/min (ref >60)
GFR calc non Af Amer: >60 mL/min (ref >60)
Glucose, Bld: 119 mg/dL — ABNORMAL HIGH (ref 70–99)
Potassium: 4.5 mmol/L (ref 3.5–5.1)
Sodium: 140 mmol/L (ref 135–145)

## 2019-06-18 LAB — TROPONIN I (HIGH SENSITIVITY)
Troponin I (High Sensitivity): 3 ng/L (ref <18)
Troponin I (High Sensitivity): 3 ng/L (ref <18)

## 2019-06-18 NOTE — ED Triage Notes (Signed)
Says for one hour his mid chest feels like he has to burp and short of breath.  Says he has had a lot of stress last 3 weeks.

## 2019-06-18 NOTE — ED Notes (Signed)
Pt reports more reflux symptoms and reports a history of acid reflux. He says it's more of a burning in his chest than SOB.

## 2019-06-18 NOTE — ED Provider Notes (Signed)
[facility] Emergency Department Provider Note  ____________________________________________  Time seen: Approximately 9:43 AM  I have reviewed the triage vital signs and the nursing notes.   HISTORY  Chief Complaint Shortness of Breath    HPI Carlos Gentry is a 53 y.o. male with a history of GERD, hyperlipidemia, hypertension who comes the ED complaining of chest pain started this morning at about 8:00 AM.  Described as burning, nonradiating.  Associated with mild shortness of breath.  No diaphoresis or vomiting.  Feels like previous episodes of GERD.  No aggravating factors, not exertional, not pleuritic.  Slightly alleviated by sitting upright and stretching his chest with thoracic lordosis.  Reports increased stress lately including working third shift for [employer] where they have had 16 recent COVID cases at his facility.  He is also been doing a lot of work on his house that he recently sold yesterday including painting and sewing on furniture and not getting much sleep and eating a lot of fast food.  This is caused him interpersonal conflict with his wife as well.   Past Medical History:  Diagnosis Date  . Fatty liver    11/18  . GERD (gastroesophageal reflux disease)   . Hypercholesteremia   . Tonsillitis    4/18     Patient Active Problem List   Diagnosis Date Noted  . Cervical radiculitis 01/13/2018  . Tingling in extremities 08/27/2017  . Long term current use of opiate analgesic 05/27/2017  . Chronic pain syndrome 05/27/2017  . DDD (degenerative disc disease), cervical 05/27/2017  . Benign essential hypertension 03/21/2016  . Obesity, unspecified 03/21/2016  . Pure hypercholesterolemia 03/21/2016  . Borderline diabetes mellitus 01/04/2016  . Other specified counseling 01/04/2016  . Vaccine counseling 01/04/2016  . Chronic neck pain 03/03/2014     Past Surgical History:  Procedure Laterality Date  . CERVICAL SPINE SURGERY     . ELBOW SURGERY Left      Prior to Admission medications   Medication Sig Start Date End Date Taking? Authorizing Provider  fluticasone (FLONASE) 50 MCG/ACT nasal spray Place into both nostrils daily. prn    [provider]  gabapentin (NEURONTIN) 300 MG capsule TAKE 1 CAPSULE BY MOUTH EVERY DAY AT NIGHT 12/20/17   [provider]  gabapentin (NEURONTIN) 300 MG capsule Take 1 capsule (300 mg total) by mouth 3 (three) times daily. 11/19/18 02/17/19  Barbette MerinoKing, Crystal M, NP  meloxicam (MOBIC) 15 MG tablet Take 15 mg by mouth as needed.     [provider]  omeprazole (PRILOSEC) 40 MG capsule Take 40 mg by mouth 3 (three) times daily.    [provider]  pravastatin (PRAVACHOL) 10 MG tablet Take 10 mg by mouth daily.    [provider]  PREVIDENT 5000 SENSITIVE 1.1-5 % PSTE Take 1 application by mouth daily. 07/02/17   [provider]  traMADol (ULTRAM) 50 MG tablet TAKE 1 TABLET BY MOUTH EVERY 6 HOURS AS NEEDED FOR MODERATE PAIN 11/19/18   Barbette MerinoKing, Crystal M, NP     Allergies Patient has no known allergies.   Family History  Problem Relation Age of Onset  . Diabetes Mother   . Heart disease Father   . Stroke Father     Social History Social History   Tobacco Use  . Smoking status: Never Smoker  . Smokeless tobacco: Never Used  Substance Use Topics  . Alcohol use: No    Alcohol/week: 0.0 standard drinks  Frequency: Never  . Drug use: No    Review of Systems  Constitutional:   No fever or chills.  No body aches ENT:   No sore throat. No rhinorrhea. Cardiovascular:   Positive as above chest pain without syncope. Respiratory: Positive mild shortness of breath and nonproductive cough. Gastrointestinal:   Negative for abdominal pain, vomiting and diarrhea.  Musculoskeletal:   Negative for focal pain or swelling All other systems reviewed and are negative except as documented above in ROS and  HPI.  ____________________________________________   PHYSICAL EXAM:  VITAL SIGNS: ED Triage Vitals [06/18/19 0909]  Enc Vitals Group     BP (!) 150/80     Pulse Rate (!) 106     Resp 16     Temp 98.2 F (36.8 C)     Temp Source Oral     SpO2 100 %     Weight 235 lb (106.6 kg)     Height 5\' 8"  (1.727 m)     Head Circumference      Peak Flow      Pain Score 0     Pain Loc      Pain Edu?      Excl. in [place]?     Vital signs reviewed, nursing assessments reviewed.   Constitutional:   Alert and oriented. Non-toxic appearance. Eyes:   Conjunctivae are normal. EOMI. PERRL. ENT      Head:   Normocephalic and atraumatic.      Nose:   Wearing a mask      Mouth/Throat: Mask      Neck:   No meningismus. Full ROM. Hematological/Lymphatic/Immunilogical:   No cervical lymphadenopathy. Cardiovascular:   RRR, heart rate 80. Symmetric bilateral radial and DP pulses.  No murmurs. Cap refill less than 2 seconds. Respiratory:   Normal respiratory effort without tachypnea/retractions. Breath sounds are clear and equal bilaterally. No wheezes/rales/rhonchi.  No inducible wheezing or cough with FEV1 maneuver Gastrointestinal:   Soft and nontender. Non distended. There is no CVA tenderness.  No rebound, rigidity, or guarding.  Musculoskeletal:   Normal range of motion in all extremities. No joint effusions.  No lower extremity tenderness.  No edema.  Chest wall nontender Neurologic:   Normal speech and language.  Motor grossly intact. No acute focal neurologic deficits are appreciated.  Skin:    Skin is warm, dry and intact. No rash noted.  No petechiae, purpura, or bullae.  ____________________________________________    LABS (pertinent positives/negatives) (all labs ordered are listed, but only abnormal results are displayed) Labs Reviewed  BASIC METABOLIC PANEL - Abnormal; Notable for the following components:      Result Value   Glucose, Bld 119 (*)    All other components within  normal limits  CBC WITH DIFFERENTIAL/PLATELET - Abnormal; Notable for the following components:   WBC 3.3 (*)    Neutro Abs 1.2 (*)    All other components within normal limits  NOVEL CORONAVIRUS, NAA (HOSP ORDER, SEND-OUT TO REF LAB; TAT 18-24 HRS)  TROPONIN I (HIGH SENSITIVITY)  TROPONIN I (HIGH SENSITIVITY)   ____________________________________________   EKG  Interpreted by me  Date: 06/18/2019  Rate: 91  Rhythm: normal sinus rhythm  QRS Axis: normal  Intervals: normal  ST/T Wave abnormalities: normal  Conduction Disutrbances: none  Narrative Interpretation: unremarkable      ____________________________________________    RADIOLOGY  Dg Chest Portable 1 View  Result Date: 06/18/2019 CLINICAL DATA:  Cough with central chest pain EXAM: PORTABLE CHEST 1  VIEW COMPARISON:  09/16/2011 FINDINGS: Normal heart size and mediastinal contours. No acute infiltrate or edema. No effusion or pneumothorax. No acute osseous findings. Interval ACDF. IMPRESSION: No active disease. Electronically Signed   By: Marnee Spring M.D.   On: 06/18/2019 09:57    ____________________________________________   PROCEDURES Procedures  ____________________________________________  DIFFERENTIAL DIAGNOSIS   GERD, COVID-19, bronchitis, pneumonia.  CLINICAL IMPRESSION / ASSESSMENT AND PLAN / ED COURSE  Medications ordered in the ED: Medications - No data to display  Pertinent labs & imaging results that were available during my care of the patient were reviewed by me and considered in my medical decision making (see chart for details).  Carlos Gentry was evaluated in Emergency Department on 06/18/2019 for the symptoms described in the history of present illness. He was evaluated in the context of the global COVID-19 pandemic, which necessitated consideration that the patient might be at risk for infection with the SARS-CoV-2 virus that causes COVID-19. Institutional protocols and algorithms that  pertain to the evaluation of patients at risk for COVID-19 are in a state of rapid change based on information released by regulatory bodies including the CDC and federal and state organizations. These policies and algorithms were followed during the patient's care in the ED.   Patient presents with atypical chest pain.  Most likely due to viral pneumonitis versus pneumonia versus GERD.Considering the patient's symptoms, medical history, and physical examination today, I have low suspicion for ACS, PE, TAD, pneumothorax, carditis, mediastinitis, pneumonia, CHF, or sepsis.  Due to his medical history and age, I will obtain delta troponin to further risk stratify although symptoms are not cardiac.   ----------------------------------------- 12:46 PM on 06/18/2019 -----------------------------------------  Serial troponins are both negative.  Vital signs remain normal.  Work-up is reassuring, likely acute bronchitis possibly viral syndrome, versus GERD.  COVID test pending.  Stable for outpatient follow-up.     ____________________________________________   FINAL CLINICAL IMPRESSION(S) / ED DIAGNOSES    Final diagnoses:  Atypical chest pain     ED Discharge Orders    None      Portions of this note were generated with dragon dictation software. Dictation errors may occur despite best attempts at proofreading.   Sharman Cheek, MD 06/18/19 1246

## 2019-06-18 NOTE — Discharge Instructions (Signed)
Please isolate yourself at home over the weekend and avoid contact with others until your coronavirus test result is available.  We will call you if it is positive.  If negative the result will be available in the [employer].  Your chest x-ray and lab test today were all okay and do not show any acute issues with your heart or lungs.  Continue to stay hydrated, rest, do light activity to stay moving, and take your medications.

## 2019-06-19 LAB — NOVEL CORONAVIRUS, NAA (HOSP ORDER, SEND-OUT TO REF LAB; TAT 18-24 HRS): SARS-CoV-2, NAA: NOT DETECTED

## 2019-06-22 ENCOUNTER — Ambulatory Visit: Payer: 59 | Attending: Anesthesiology | Admitting: Anesthesiology

## 2019-06-22 ENCOUNTER — Encounter: Payer: Self-pay | Admitting: Anesthesiology

## 2019-06-22 ENCOUNTER — Other Ambulatory Visit: Payer: Self-pay

## 2019-06-22 DIAGNOSIS — G894 Chronic pain syndrome: Secondary | ICD-10-CM

## 2019-06-22 DIAGNOSIS — Z79891 Long term (current) use of opiate analgesic: Secondary | ICD-10-CM

## 2019-06-22 DIAGNOSIS — M5030 Other cervical disc degeneration, unspecified cervical region: Secondary | ICD-10-CM

## 2019-06-22 DIAGNOSIS — M5412 Radiculopathy, cervical region: Secondary | ICD-10-CM

## 2019-06-22 DIAGNOSIS — M542 Cervicalgia: Secondary | ICD-10-CM

## 2019-06-22 DIAGNOSIS — G8929 Other chronic pain: Secondary | ICD-10-CM

## 2019-06-22 MED ORDER — TRAMADOL HCL 50 MG PO TABS: ORAL_TABLET | ORAL | 2 refills | Status: DC

## 2019-06-22 NOTE — Progress Notes (Signed)
Virtual Visit via Telephone Note  I connected with Carlos Gentry on 06/22/19 at  2:00 PM EDT by telephone and verified that I am speaking with the correct person using two identifiers.  Location: Patient: Home Provider: Pain control center   I discussed the limitations, risks, security and privacy concerns of performing an evaluation and management service by telephone and the availability of in person appointments. I also discussed with the patient that there may be a patient responsible charge related to this service. The patient expressed understanding and agreed to proceed.   History of Present Illness: I spoke with Carlos Gentry today regarding his neck pain.  He has been on a regimen taking tramadol 3-4 times a day and this is been working well for him.  Based on our conversation today he continues to get good relief with the medication and no side effects are reported.  He generally goes 3 to 4 hours with good relief then started to have some mild recurrence but it depends on his level of activity.  He also takes occasional Tylenol to help with the pain.  His strength and sensory function to the upper extremities has been stable and it is mainly a gnawing aching pain he gets in his posterior neck.  This is been stable in nature.  Otherwise he is in his usual state of health at this time.    Observations/Objective: Current Outpatient Medications:  .  fluticasone (FLONASE) 50 MCG/ACT nasal spray, Place into both nostrils daily. prn, Disp: , Rfl:  .  gabapentin (NEURONTIN) 300 MG capsule, TAKE 1 CAPSULE BY MOUTH EVERY DAY AT NIGHT, Disp: , Rfl: 3 .  gabapentin (NEURONTIN) 300 MG capsule, Take 1 capsule (300 mg total) by mouth 3 (three) times daily., Disp: 90 capsule, Rfl: 2 .  meloxicam (MOBIC) 15 MG tablet, Take 15 mg by mouth as needed. , Disp: , Rfl:  .  omeprazole (PRILOSEC) 40 MG capsule, Take 40 mg by mouth 3 (three) times daily., Disp: , Rfl:  .  pravastatin (PRAVACHOL) 10 MG tablet, Take  10 mg by mouth daily., Disp: , Rfl:  .  PREVIDENT 5000 SENSITIVE 1.1-5 % PSTE, Take 1 application by mouth daily., Disp: , Rfl: 0 .  traMADol (ULTRAM) 50 MG tablet, TAKE 1 TABLET BY MOUTH EVERY 6 HOURS AS NEEDED FOR MODERATE PAIN, Disp: 120 tablet, Rfl: 2   Assessment and Plan: 1. Chronic pain syndrome   2. Cervical radiculitis   3. DDD (degenerative disc disease), cervical   4. Chronic neck pain   5. Long term current use of opiate analgesic   Based on our discussion today and upon review of the [place] practitioner database information I am going to refill his medications for the next 3 months.  I will schedule him for return to clinic in 3 months.  Want him to continue with stretching strengthening exercises as previously reviewed and he should contact us should he have any changes in symptom complex.   Follow Up Instructions:    I discussed the assessment and treatment plan with the patient. The patient was provided an opportunity to ask questions and all were answered. The patient agreed with the plan and demonstrated an understanding of the instructions.   The patient was advised to call back or seek an in-person evaluation if the symptoms worsen or if the condition fails to improve as anticipated.  I provided 30 minutes of non-face-to-face time during this encounter.   Molli Barrows, MD

## 2019-08-01 ENCOUNTER — Emergency Department: Payer: 59

## 2019-08-01 ENCOUNTER — Other Ambulatory Visit: Payer: Self-pay

## 2019-08-01 ENCOUNTER — Encounter: Payer: Self-pay | Admitting: Emergency Medicine

## 2019-08-01 ENCOUNTER — Emergency Department
Admission: EM | Admit: 2019-08-01 | Discharge: 2019-08-01 | Disposition: A | Discharge status: Home / Self Care | Payer: 59 | Attending: Emergency Medicine | Admitting: Emergency Medicine

## 2019-08-01 DIAGNOSIS — M5441 Lumbago with sciatica, right side: Secondary | ICD-10-CM | POA: Diagnosis not present

## 2019-08-01 DIAGNOSIS — M549 Dorsalgia, unspecified: Secondary | ICD-10-CM | POA: Diagnosis present

## 2019-08-01 DIAGNOSIS — R109 Unspecified abdominal pain: Secondary | ICD-10-CM | POA: Insufficient documentation

## 2019-08-01 LAB — URINALYSIS, ROUTINE W REFLEX MICROSCOPIC
Bacteria, UA: NONE SEEN
Bilirubin Urine: NEGATIVE
Glucose, UA: NEGATIVE mg/dL
Ketones, ur: 5 mg/dL — AB
Leukocytes,Ua: NEGATIVE
Nitrite: NEGATIVE
Protein, ur: NEGATIVE mg/dL
Specific Gravity, Urine: 1.021 (ref 1.005–1.030)
Squamous Epithelial / HPF: NONE SEEN (ref 0–5)
pH: 5 (ref 5.0–8.0)

## 2019-08-01 MED ORDER — ORPHENADRINE CITRATE ER 100 MG PO TB12: 100.0000 mg | ORAL_TABLET | Freq: Two times a day (BID) | ORAL | 0 refills | Status: AC

## 2019-08-01 MED ORDER — HYDROCODONE-ACETAMINOPHEN 5-325 MG PO TABS
1.0000 | ORAL_TABLET | Freq: Once | ORAL | Status: AC
  Administered 2019-08-01: 18:00:00 1 via ORAL
  Filled 2019-08-01: qty 1

## 2019-08-01 MED ORDER — ORPHENADRINE CITRATE ER 100 MG PO TB12: 100.0000 mg | ORAL_TABLET | Freq: Once | ORAL | Status: DC

## 2019-08-01 MED ORDER — HYDROCODONE-ACETAMINOPHEN 5-325 MG PO TABS
1.0000 | ORAL_TABLET | Freq: Once | ORAL | Status: AC
  Administered 2019-08-01: 16:00:00 1 via ORAL
  Filled 2019-08-01: qty 1

## 2019-08-01 MED ORDER — KETOROLAC TROMETHAMINE 30 MG/ML IJ SOLN
30.0000 mg | Freq: Once | INTRAMUSCULAR | Status: AC
  Administered 2019-08-01: 30 mg via INTRAVENOUS
  Filled 2019-08-01: qty 1

## 2019-08-01 MED ORDER — METHOCARBAMOL 500 MG PO TABS
500.0000 mg | ORAL_TABLET | Freq: Once | ORAL | Status: AC
  Administered 2019-08-01: 15:00:00 500 mg via ORAL
  Filled 2019-08-01: qty 1

## 2019-08-01 MED ORDER — HYDROCODONE-ACETAMINOPHEN 5-325 MG PO TABS: 1.0000 | ORAL_TABLET | Freq: Three times a day (TID) | ORAL | 0 refills | Status: DC | PRN

## 2019-08-01 NOTE — ED Triage Notes (Addendum)
Pt arrived via POV with reports of low right back pain radiating down his right leg. 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 Pt states the pain started this morning around 8am but worsened throughout the day.  Pt using ice, heat and icy hot, still no relief.  Pt states he takes Tramadol for chronic neck pain, pt states he took 2 tabs around 8am, pt states he took an additional dose around 11am and took ibuprofen and tylenol.

## 2019-08-01 NOTE — ED Notes (Signed)
See triage note  Presents with lower back pain which is radiating into right leg    States he woke up with pain around 10 am  States pain increased thur out the day  States he took tramadol this am w/o relief  Ambulates with limp d/t pain

## 2019-08-01 NOTE — Discharge Instructions (Signed)
You were seen today for acute right low back pain.  Your x-ray shows some arthritis in the lumbar spine but no acute findings.  Your CT scan shows a small left kidney stone, but this is not the cause of your pain.  I am providing you with a prescription for pain medicine and muscle relaxers, take as prescribed.  We also recommend heat and massage.  Avoid heavy lifting over the next week.  Follow-up with your PCP if symptoms persist or worsen.

## 2019-08-01 NOTE — ED Provider Notes (Signed)
[facility] Emergency Department Provider Note ____________________________________________  Time seen: 1450  I have reviewed the triage vital signs and the nursing notes.  HISTORY  Chief Complaint  Back Pain   HPI Carlos Gentry is a 53 y.o. male presents to the ER today with acute onset right lower back pain.  He reports he woke up this morning with the pain.  He describes the pain as a constant dull ache, but can be sharp and stabbing with sitting or laying down.  He reports the pain radiates into his right upper thigh.  He reports some associated tingling but denies numbness or weakness.  He denies rash of the back.  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  He denies loss of bowel or bladder control.  He has tried Ibuprofen, Tylenol and tramadol without any relief.  Past Medical History:  Diagnosis Date  . Fatty liver    11/18  . GERD (gastroesophageal reflux disease)   . Hypercholesteremia   . Tonsillitis    4/18    Patient Active Problem List   Diagnosis Date Noted  . Cervical radiculitis 01/13/2018  . Tingling in extremities 08/27/2017  . Long term current use of opiate analgesic 05/27/2017  . Chronic pain syndrome 05/27/2017  . DDD (degenerative disc disease), cervical 05/27/2017  . Benign essential hypertension 03/21/2016  . Obesity, unspecified 03/21/2016  . Pure hypercholesterolemia 03/21/2016  . Borderline diabetes mellitus 01/04/2016  . Other specified counseling 01/04/2016  . Vaccine counseling 01/04/2016  . Chronic neck pain 03/03/2014    Past Surgical History:  Procedure Laterality Date  . CERVICAL SPINE SURGERY    . ELBOW SURGERY Left     Prior to Admission medications   Medication Sig Start Date End Date Taking? Authorizing Provider  fluticasone (FLONASE) 50 MCG/ACT nasal spray Place into both nostrils daily. prn    [provider]  gabapentin (NEURONTIN) 300 MG  capsule TAKE 1 CAPSULE BY MOUTH EVERY DAY AT NIGHT 12/20/17   [provider]  gabapentin (NEURONTIN) 300 MG capsule Take 1 capsule (300 mg total) by mouth 3 (three) times daily. 11/19/18 02/17/19  Vevelyn Francois, NP  HYDROcodone-acetaminophen (NORCO/VICODIN) 5-325 MG tablet Take 1 tablet by mouth every 8 (eight) hours as needed for moderate pain. 08/01/19   Jearld Fenton, NP  meloxicam (MOBIC) 15 MG tablet Take 15 mg by mouth as needed.     [provider]  omeprazole (PRILOSEC) 40 MG capsule Take 40 mg by mouth 3 (three) times daily.    [provider]  orphenadrine (NORFLEX) 100 MG tablet Take 1 tablet (100 mg total) by mouth 2 (two) times daily. 08/01/19 07/31/20  Jearld Fenton, NP  pravastatin (PRAVACHOL) 10 MG tablet Take 10 mg by mouth daily.    [provider]  PREVIDENT 5000 SENSITIVE 1.1-5 % PSTE Take 1 application by mouth daily. 07/02/17   [provider]  traMADol (ULTRAM) 50 MG tablet TAKE 1 TABLET BY MOUTH EVERY 6 HOURS AS NEEDED FOR MODERATE PAIN 06/22/19   Molli Barrows, MD    Allergies Patient has no known allergies.  Family History  Problem Relation Age of Onset  . Diabetes Mother   . Heart disease Father   . Stroke Father     Social History Social History   Tobacco Use  . Smoking status: Never Smoker  . Smokeless tobacco: Never Used  Substance Use Topics  .  Alcohol use: No    Alcohol/week: 0.0 standard drinks    Frequency: Never  . Drug use: No    Review of Systems  Constitutional: Negative for fever. Cardiovascular: Negative for chest pain or chest tightness. Respiratory: Negative for cough or shortness of breath. Gastrointestinal: Negative for abdominal pain, constipation. Genitourinary: Negative for urgency, frequency, dysuria. Musculoskeletal: Positive for back pain. Skin: Negative for rash. Neurological: Positive for tingling in the RLE. Negative for headaches, focal weakness or  numbness. ____________________________________________  PHYSICAL EXAM:  VITAL SIGNS: ED Triage Vitals  Enc Vitals Group     BP 08/01/19 1351 (!) 166/92     Pulse Rate 08/01/19 1351 72     Resp 08/01/19 1351 18     Temp 08/01/19 1351 98.7 F (37.1 C)     Temp Source 08/01/19 1351 Oral     SpO2 08/01/19 1351 100 %     Weight 08/01/19 1352 230 lb (104.3 kg)     Height 08/01/19 1352 5\' 8"  (1.727 m)     Head Circumference --      Peak Flow --      Pain Score 08/01/19 1351 9     Pain Loc --      Pain Edu? --      Excl. in [place]? --     Constitutional: Alert and oriented. Obese, appears in pain but in no distress. Cardiovascular: Normal rate, regular rhythm. Pedal pulses 2+ bilaterally. Respiratory: Normal respiratory effort. No wheezes/rales/rhonchi. Gastrointestinal: Soft and nontender. No distention. Musculoskeletal: Normal rotation to the right. Pain with flexion, extension and rotation to the left. No bony tenderness noted over the lumbar spine. Able to stand on tip toes and heels. Strength 4/5 RLE, 5/5 LLE. Limps with normal gait.  Neurologic:  Normal gait without ataxia. Normal speech and language. Positive SLR on the left. No gross focal neurologic deficits are appreciated. Skin:  Skin is warm, dry and intact. No rash noted.  ____________________________________________  RADIOLOGY   Imaging Orders     DG Lumbar Spine Complete     CT Renal Stone Study IMPRESSION:  1. There is a 2 mm nonobstructive stone in the lower pole the left  kidney. There is a small cyst in the lower pole of the left kidney.  2. Mild caliectasis in the lower pole of the left kidney of  uncertain chronicity. There is no perinephric stranding or cause of  acute obstruction identified.  3. The appendix is normal with no evidence of appendicitis.  4. No other abnormalities.   IMPRESSION: 1. No fracture or acute finding. 2. Degenerative changes as  described. ____________________________________________    INITIAL IMPRESSION / ASSESSMENT AND PLAN / ED COURSE  Acute Right Sided Low Back Pain with Right Sided Sciatica:  Xray lumbar spine Toradol 30 mg IM x 1 in ER Methocarbamol 500 mg PO x 1 in ER He thinks now he may have a kidney stone Urinalysis 1+ blood Norco 5-325 mg PO x 1 in ER CT renal stone study, no kidney stone on the right NCCSRS reviewed- gets monthly Tramadol from PCP, no other controlled substance RX for Vicodin 5-325 mg TID prn RX for Norflex 100 mg PO BID prn- sedation caution given   ____________________________________________  FINAL CLINICAL IMPRESSION(S) / ED DIAGNOSES  Final diagnoses:  Acute right-sided low back pain with right-sided sciatica   13/01/20, NP     Nicki Reaper, NP 08/01/19 1757    13/01/20, MD 08/03/19 1206

## 2019-08-06 ENCOUNTER — Other Ambulatory Visit: Payer: Self-pay | Admitting: Family Medicine

## 2019-08-06 DIAGNOSIS — M5416 Radiculopathy, lumbar region: Secondary | ICD-10-CM

## 2019-08-18 ENCOUNTER — Other Ambulatory Visit: Payer: Self-pay

## 2019-08-18 ENCOUNTER — Ambulatory Visit
Admission: RE | Admit: 2019-08-18 | Discharge: 2019-08-18 | Disposition: A | Discharge status: Home / Self Care | Payer: 59 | Source: Ambulatory Visit | Attending: Family Medicine | Admitting: Family Medicine

## 2019-08-18 DIAGNOSIS — M5416 Radiculopathy, lumbar region: Secondary | ICD-10-CM | POA: Diagnosis present

## 2019-09-25 ENCOUNTER — Other Ambulatory Visit: Payer: Self-pay | Admitting: Anesthesiology

## 2019-09-29 ENCOUNTER — Other Ambulatory Visit: Payer: Self-pay

## 2019-09-29 ENCOUNTER — Encounter: Payer: Self-pay | Admitting: Anesthesiology

## 2019-09-29 ENCOUNTER — Ambulatory Visit: Payer: 59 | Attending: Anesthesiology | Admitting: Anesthesiology

## 2019-09-29 DIAGNOSIS — G894 Chronic pain syndrome: Secondary | ICD-10-CM | POA: Diagnosis not present

## 2019-09-29 DIAGNOSIS — M5412 Radiculopathy, cervical region: Secondary | ICD-10-CM | POA: Diagnosis not present

## 2019-09-29 DIAGNOSIS — Z79891 Long term (current) use of opiate analgesic: Secondary | ICD-10-CM

## 2019-09-29 DIAGNOSIS — M5030 Other cervical disc degeneration, unspecified cervical region: Secondary | ICD-10-CM | POA: Diagnosis not present

## 2019-09-29 DIAGNOSIS — M542 Cervicalgia: Secondary | ICD-10-CM

## 2019-09-29 DIAGNOSIS — G8929 Other chronic pain: Secondary | ICD-10-CM

## 2019-09-29 MED ORDER — TRAMADOL HCL 50 MG PO TABS: ORAL_TABLET | ORAL | 2 refills | Status: DC

## 2019-09-29 NOTE — Progress Notes (Signed)
Virtual Visit via Telephone Note  I connected with Carlos Gentry on 09/29/19 at 10:15 AM EST by telephone and verified that I am speaking with the correct person using two identifiers.  Location: Patient: Home Provider: Pain control center   I discussed the limitations, risks, security and privacy concerns of performing an evaluation and management service by telephone and the availability of in person appointments. I also discussed with the patient that there may be a patient responsible charge related to this service. The patient expressed understanding and agreed to proceed.   History of Present Illness: I spoke with Carlos Gentry today via telephone as he was not able to do the video portion of the virtual conference.  He stated that he is doing well in regards to his chronic neck pain and that it has been stable and the tramadol continues to give him good relief.  His work intensity does increase over Christmas and he has had to do some more lifting and this has caused some aggravation of his chronic neck pain.  The quality is similar no new upper extremity strength changes are noted.  He did recently have 1 bout of low back pain but this has resolved after 3 days of ice and therapy.  Otherwise no problems with the tramadol are reported.  No side effects with the medication and he continues to get good relief.    Observations/Objective:  Current Outpatient Medications:  .  fluticasone (FLONASE) 50 MCG/ACT nasal spray, Place into both nostrils daily. prn, Disp: , Rfl:  .  gabapentin (NEURONTIN) 300 MG capsule, TAKE 1 CAPSULE BY MOUTH EVERY DAY AT NIGHT, Disp: , Rfl: 3 .  gabapentin (NEURONTIN) 300 MG capsule, Take 1 capsule (300 mg total) by mouth 3 (three) times daily., Disp: 90 capsule, Rfl: 2 .  HYDROcodone-acetaminophen (NORCO/VICODIN) 5-325 MG tablet, Take 1 tablet by mouth every 8 (eight) hours as needed for moderate pain., Disp: 15 tablet, Rfl: 0 .  meloxicam (MOBIC) 15 MG tablet, Take 15  mg by mouth as needed. , Disp: , Rfl:  .  omeprazole (PRILOSEC) 40 MG capsule, Take 40 mg by mouth 3 (three) times daily., Disp: , Rfl:  .  orphenadrine (NORFLEX) 100 MG tablet, Take 1 tablet (100 mg total) by mouth 2 (two) times daily., Disp: 10 tablet, Rfl: 0 .  pravastatin (PRAVACHOL) 10 MG tablet, Take 10 mg by mouth daily., Disp: , Rfl:  .  PREVIDENT 5000 SENSITIVE 1.1-5 % PSTE, Take 1 application by mouth daily., Disp: , Rfl: 0 .  traMADol (ULTRAM) 50 MG tablet, TAKE 1 TABLET BY MOUTH EVERY 6 HOURS AS NEEDED FOR MODERATE PAIN, Disp: 120 tablet, Rfl: 2  Assessment and Plan: 1. Chronic pain syndrome   2. Cervical radiculitis   3. DDD (degenerative disc disease), cervical   4. Chronic neck pain   5. Long term current use of opiate analgesic   Based on our discussion today and upon review of the [place] practitioner database information I am going to refill his tramadol for 120 tablets on 4 times daily dosing for the next 3 months with return to clinic in 3 months.  I have mentioned that addition of an anti-inflammatory such as Naprosyn taken at 3-day intervals could be effective to help reduce some of the neck pain when he does get through these increased work phases.  I have also reiterated that he needs to be careful about any lifting with potential for reinjury of his neck.  I want  him to continue follow-up with his primary care physicians and we also talked about continued stretching strengthening exercises for his upper neck and back and lower back.  He is instructed to contact us at the pain control center should he have any problems with pain management over the next 3 months  Follow Up Instructions:    I discussed the assessment and treatment plan with the patient. The patient was provided an opportunity to ask questions and all were answered. The patient agreed with the plan and demonstrated an understanding of the instructions.   The patient was advised to call back or seek an  in-person evaluation if the symptoms worsen or if the condition fails to improve as anticipated.  I provided 30 minutes of non-face-to-face time during this encounter.   Yevette Edwards, MD

## 2020-01-10 ENCOUNTER — Ambulatory Visit: Payer: 59 | Attending: Anesthesiology | Admitting: Anesthesiology

## 2020-01-10 ENCOUNTER — Encounter: Payer: Self-pay | Admitting: Anesthesiology

## 2020-01-10 ENCOUNTER — Other Ambulatory Visit: Payer: Self-pay

## 2020-01-10 DIAGNOSIS — M542 Cervicalgia: Secondary | ICD-10-CM | POA: Diagnosis not present

## 2020-01-10 DIAGNOSIS — M5412 Radiculopathy, cervical region: Secondary | ICD-10-CM | POA: Diagnosis not present

## 2020-01-10 DIAGNOSIS — G8929 Other chronic pain: Secondary | ICD-10-CM

## 2020-01-10 DIAGNOSIS — M5030 Other cervical disc degeneration, unspecified cervical region: Secondary | ICD-10-CM

## 2020-01-10 DIAGNOSIS — Z79891 Long term (current) use of opiate analgesic: Secondary | ICD-10-CM

## 2020-01-10 DIAGNOSIS — G894 Chronic pain syndrome: Secondary | ICD-10-CM

## 2020-01-10 MED ORDER — TRAMADOL HCL 50 MG PO TABS: ORAL_TABLET | ORAL | 2 refills | Status: DC

## 2020-01-10 NOTE — Progress Notes (Signed)
Virtual Visit via Video Note  I connected with Carlos Gentry on 01/10/20 at 11:15 AM EDT by a video enabled telemedicine application and verified that I am speaking with the correct person using two identifiers.  Location: Patient: Home Provider: Pain control center   I discussed the limitations of evaluation and management by telemedicine and the availability of in person appointments. The patient expressed understanding and agreed to proceed.  History of Present Illness: I spoke with Mr. Carlos Gentry today via video for his virtual conference.  He has been doing well with his neck pain and has had a bout of low back pain back in November.  He reports that he had a sudden onset of severe spasming with some right anterior thigh numbness that forced him to see the ER physicians for evaluation.  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  I went over the results of this with him today.  He reports that his low back pain has resolved and he has no complaints of any lower extremity numbness tingling or bowel or bladder function.  Furthermore his neck pain is been stable.  He is doing his exercises and staying active.  He takes his tramadol 1 tablet 4 times a day and this works well for him.  No side effects are noted and he keeps him active and functional.  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  The quality characteristic and distribution of his neck pain have been stable in nature and generally well controlled with the tramadol.  Occasionally he will take gabapentin if he gets some leg numbness or tingling but this is rare and sparing in use.  Furthermore he uses Naprosyn on occasion if he gets a flareup.   Observations/Objective:  Current Outpatient Medications:  .  fluticasone (FLONASE) 50 MCG/ACT nasal spray, Place into both nostrils  daily. prn, Disp: , Rfl:  .  gabapentin (NEURONTIN) 300 MG capsule, TAKE 1 CAPSULE BY MOUTH EVERY DAY AT NIGHT, Disp: , Rfl: 3 .  gabapentin (NEURONTIN) 300 MG capsule, Take 1 capsule (300 mg total) by mouth 3 (three) times daily., Disp: 90 capsule, Rfl: 2 .  HYDROcodone-acetaminophen (NORCO/VICODIN) 5-325 MG tablet, Take 1 tablet by mouth every 8 (eight) hours as needed for moderate pain., Disp: 15 tablet, Rfl: 0 .  meloxicam (MOBIC) 15 MG tablet, Take 15 mg by mouth as needed. , Disp: , Rfl:  .  omeprazole (PRILOSEC) 40 MG capsule, Take 40 mg by mouth 3 (three) times daily., Disp: , Rfl:  .  orphenadrine (NORFLEX) 100 MG tablet, Take 1 tablet (100 mg total) by mouth 2 (two) times daily., Disp: 10 tablet, Rfl: 0 .  pravastatin (PRAVACHOL) 10 MG tablet, Take 10 mg by mouth daily., Disp: , Rfl:  .  PREVIDENT 5000 SENSITIVE 1.1-5 % PSTE, Take 1 application by mouth daily., Disp: , Rfl: 0 .  traMADol (ULTRAM) 50 MG tablet, TAKE 1 TABLET BY MOUTH EVERY 6 HOURS AS NEEDED FOR MODERATE PAIN, Disp: 120 tablet, Rfl: 2  Mri results reviewed   . Assessment and Plan: 1. Chronic pain syndrome   2. Cervical radiculitis   3. DDD (degenerative disc disease), cervical   4. Chronic neck pain   5. Long term current use of opiate analgesic    Based on our discussion today and upon review of the  [facility] practitioner database information going to refill his tramadol for the next 3 months with return to clinic in 3 months.  He continues to get good relief with this regimen and continues to do stretching strengthening exercises for his neck pain and low back pain.  I am pleased that he is doing well. Follow Up Instructions:    I discussed the assessment and treatment plan with the patient. The patient was provided an opportunity to ask questions and all were answered. The patient agreed with the plan and demonstrated an understanding of the instructions.   The patient was advised to call back or seek an  in-person evaluation if the symptoms worsen or if the condition fails to improve as anticipated.  I provided 25 minutes of non-face-to-face time during this encounter.   Molli Barrows, MD

## 2020-03-23 ENCOUNTER — Telehealth: Payer: Self-pay | Admitting: Anesthesiology

## 2020-03-23 ENCOUNTER — Other Ambulatory Visit: Payer: Self-pay | Admitting: Anesthesiology

## 2020-03-23 DIAGNOSIS — M5412 Radiculopathy, cervical region: Secondary | ICD-10-CM

## 2020-03-23 NOTE — Telephone Encounter (Signed)
Dr. Pernell Dupre is on vacation until 06/28. Patient will have to wait until Monday for this refill.

## 2020-03-23 NOTE — Telephone Encounter (Signed)
Patient called to see if he can get gabapentin sent in. He is out. I have him scheduled on Monday 03-27-20 for VV w/ Dr. Pernell Dupre for med mgmt. Please let patient know status of this request.  Thank you

## 2020-03-27 ENCOUNTER — Other Ambulatory Visit: Payer: Self-pay | Admitting: Anesthesiology

## 2020-03-27 ENCOUNTER — Other Ambulatory Visit: Payer: Self-pay

## 2020-03-27 ENCOUNTER — Ambulatory Visit: Payer: 59 | Attending: Anesthesiology | Admitting: Anesthesiology

## 2020-03-27 ENCOUNTER — Encounter: Payer: Self-pay | Admitting: Anesthesiology

## 2020-03-27 DIAGNOSIS — G894 Chronic pain syndrome: Secondary | ICD-10-CM | POA: Diagnosis not present

## 2020-03-27 DIAGNOSIS — M5030 Other cervical disc degeneration, unspecified cervical region: Secondary | ICD-10-CM | POA: Diagnosis not present

## 2020-03-27 DIAGNOSIS — M79641 Pain in right hand: Secondary | ICD-10-CM

## 2020-03-27 DIAGNOSIS — G8929 Other chronic pain: Secondary | ICD-10-CM

## 2020-03-27 DIAGNOSIS — M5412 Radiculopathy, cervical region: Secondary | ICD-10-CM

## 2020-03-27 DIAGNOSIS — M542 Cervicalgia: Secondary | ICD-10-CM

## 2020-03-27 DIAGNOSIS — Z79891 Long term (current) use of opiate analgesic: Secondary | ICD-10-CM

## 2020-03-27 HISTORY — DX: Pain in right hand: M79.641

## 2020-03-27 MED ORDER — TRAMADOL HCL 50 MG PO TABS: ORAL_TABLET | ORAL | 2 refills | Status: DC

## 2020-03-27 MED ORDER — GABAPENTIN 300 MG PO CAPS: 300.0000 mg | ORAL_CAPSULE | Freq: Three times a day (TID) | ORAL | 2 refills | Status: DC

## 2020-03-27 NOTE — Progress Notes (Signed)
Virtual Visit via Video Note  I connected with Carlos Gentry on 03/27/20 at  2:00 PM EDT by a video enabled telemedicine application and verified that I am speaking with the correct person using two identifiers.  Location: Patient: Home Provider: Pain control center   I discussed the limitations of evaluation and management by telemedicine and the availability of in person appointments. The patient expressed understanding and agreed to proceed.  History of Present Illness: I spoke with Mr. Carlos Gentry today via telephone as he was unable to do the video portion of the virtual conference.  He reports that his low back pain and neck pain have been stable in nature.  These are generally well controlled with stretching exercises and the tramadol which he takes 50 mg 4 times a day  Between the exercises and medication management he keeps his pain under reasonable control.  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  He has associated numbness and tingling affecting the palmar surface of the hand and this occasionally radiates it into the elbow at night.  He has taken gabapentin for this in the past and he uses this sparingly.  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  He is asking for an orthopedic evaluation for possible carpal tunnel evaluation or surgery.  Otherwise he is in his usual state of health and denies any problems or side effects with the tramadol.   Observations/Objective:  Current Outpatient Medications:  .  fluticasone (FLONASE) 50 MCG/ACT nasal spray, Place into both nostrils daily. prn, Disp: , Rfl:  .  gabapentin (NEURONTIN) 300 MG capsule, TAKE 1 CAPSULE BY MOUTH EVERY DAY AT NIGHT, Disp: , Rfl: 3 .  gabapentin (NEURONTIN) 300 MG capsule, Take 1 capsule (300 mg total) by mouth 3 (three) times daily., Disp: 90 capsule,  Rfl: 2 .  HYDROcodone-acetaminophen (NORCO/VICODIN) 5-325 MG tablet, Take 1 tablet by mouth every 8 (eight) hours as needed for moderate pain., Disp: 15 tablet, Rfl: 0 .  meloxicam (MOBIC) 15 MG tablet, Take 15 mg by mouth as needed. , Disp: , Rfl:  .  omeprazole (PRILOSEC) 40 MG capsule, Take 40 mg by mouth 3 (three) times daily., Disp: , Rfl:  .  orphenadrine (NORFLEX) 100 MG tablet, Take 1 tablet (100 mg total) by mouth 2 (two) times daily., Disp: 10 tablet, Rfl: 0 .  pravastatin (PRAVACHOL) 10 MG tablet, Take 10 mg by mouth daily., Disp: , Rfl:  .  PREVIDENT 5000 SENSITIVE 1.1-5 % PSTE, Take 1 application by mouth daily., Disp: , Rfl: 0 .  traMADol (ULTRAM) 50 MG tablet, TAKE 1 TABLET BY MOUTH EVERY 6 HOURS AS NEEDED FOR MODERATE PAIN, Disp: 120 tablet, Rfl: 2  Assessment and Plan: 1. Chronic pain syndrome   2. Cervical radiculitis   3. DDD (degenerative disc disease), cervical   4. Chronic neck pain   5. Long term current use of opiate analgesic   6. Right hand pain   Based on review of the [facility] practitioner database information going to refill his tramadol for 50 mg tablets 4 times a day.  I will also restart his gabapentin which she can take up to twice a day as discussed with him today.  Furthermore I am going to request a evaluation with Dr. Ernest Pine per patient request for  evaluation of his right hand pain and to determine if this is a surgical condition.  We talked about some stretching exercises and positioning limitations for the right hand secondary to the severity of pain.  We will schedule him for a 71-month return to clinic.  Follow Up Instructions:    I discussed the assessment and treatment plan with the patient. The patient was provided an opportunity to ask questions and all were answered. The patient agreed with the plan and demonstrated an understanding of the instructions.   The patient was advised to call back or seek an in-person evaluation if the symptoms  worsen or if the condition fails to improve as anticipated.  I provided 20 minutes of non-face-to-face time during this encounter.   Yevette Edwards, MD

## 2020-07-11 ENCOUNTER — Ambulatory Visit: Payer: 59 | Attending: Anesthesiology | Admitting: Anesthesiology

## 2020-07-11 ENCOUNTER — Encounter: Payer: Self-pay | Admitting: Anesthesiology

## 2020-07-11 ENCOUNTER — Other Ambulatory Visit: Payer: Self-pay

## 2020-07-11 DIAGNOSIS — Z79891 Long term (current) use of opiate analgesic: Secondary | ICD-10-CM | POA: Diagnosis not present

## 2020-07-11 DIAGNOSIS — R200 Anesthesia of skin: Secondary | ICD-10-CM

## 2020-07-11 DIAGNOSIS — M5412 Radiculopathy, cervical region: Secondary | ICD-10-CM

## 2020-07-11 DIAGNOSIS — M542 Cervicalgia: Secondary | ICD-10-CM

## 2020-07-11 DIAGNOSIS — G8929 Other chronic pain: Secondary | ICD-10-CM

## 2020-07-11 DIAGNOSIS — G894 Chronic pain syndrome: Secondary | ICD-10-CM | POA: Diagnosis not present

## 2020-07-11 DIAGNOSIS — M5030 Other cervical disc degeneration, unspecified cervical region: Secondary | ICD-10-CM | POA: Diagnosis not present

## 2020-07-11 DIAGNOSIS — M79641 Pain in right hand: Secondary | ICD-10-CM

## 2020-07-11 HISTORY — DX: Anesthesia of skin: R20.0

## 2020-07-11 MED ORDER — GABAPENTIN 100 MG PO CAPS: 100.0000 mg | ORAL_CAPSULE | Freq: Two times a day (BID) | ORAL | 2 refills | Status: DC

## 2020-07-11 MED ORDER — TRAMADOL HCL 50 MG PO TABS: ORAL_TABLET | ORAL | 2 refills | Status: DC

## 2020-07-11 NOTE — Progress Notes (Signed)
Virtual Visit via Telephone Note  I connected with Carlos Gentry on 07/11/20 at 11:00 AM EDT by telephone and verified that I am speaking with the correct person using two identifiers.  Location: Patient: Home Provider: Pain control center   I discussed the limitations, risks, security and privacy concerns of performing an evaluation and management service by telephone and the availability of in person appointments. I also discussed with the patient that there may be a patient responsible charge related to this service. The patient expressed understanding and agreed to proceed.   History of Present Illness: I spoke with Carlos Gentry today via telephone as he was unable to do the video portion of the virtual conference.  He reports that his neck pain has been stable in nature.  The tramadol has worked well for him and he is out of that.  He also has been taking some gabapentin but this made him too sleepy at the 300 mg dose.  He has reported that he is getting some occasional numbness running into the fingers of the left hand.  This is worse when he looks to his left.  When he avoids that this symptom gets better.  He has not reported or noticed any weakness affecting the upper extremities.  He has had some chronic numbness affecting the tips of his right finger that are currently under evaluation for possible carpal tunnel involvement and he is due to see an orthopedist for possible decompression surgery.  Otherwise his pain is been stable in nature and he is doing well.    Observations/Objective:   Current Outpatient Medications:  .  fluticasone (FLONASE) 50 MCG/ACT nasal spray, Place into both nostrils daily. prn, Disp: , Rfl:  .  gabapentin (NEURONTIN) 100 MG capsule, Take 1 capsule (100 mg total) by mouth 2 (two) times daily., Disp: 60 capsule, Rfl: 2 .  HYDROcodone-acetaminophen (NORCO/VICODIN) 5-325 MG tablet, Take 1 tablet by mouth every 8 (eight) hours as needed for moderate pain., Disp: 15  tablet, Rfl: 0 .  meloxicam (MOBIC) 15 MG tablet, Take 15 mg by mouth as needed. , Disp: , Rfl:  .  omeprazole (PRILOSEC) 40 MG capsule, Take 40 mg by mouth 3 (three) times daily., Disp: , Rfl:  .  orphenadrine (NORFLEX) 100 MG tablet, Take 1 tablet (100 mg total) by mouth 2 (two) times daily., Disp: 10 tablet, Rfl: 0 .  pravastatin (PRAVACHOL) 10 MG tablet, Take 10 mg by mouth daily., Disp: , Rfl:  .  PREVIDENT 5000 SENSITIVE 1.1-5 % PSTE, Take 1 application by mouth daily., Disp: , Rfl: 0 .  traMADol (ULTRAM) 50 MG tablet, TAKE 1 TABLET BY MOUTH EVERY 6 HOURS AS NEEDED FOR MODERATE PAIN, Disp: 120 tablet, Rfl: 2 Assessment and Plan: 1. Chronic pain syndrome   2. Cervical radiculitis   3. DDD (degenerative disc disease), cervical   4. Long term current use of opiate analgesic   5. Right hand pain   6. Chronic neck pain   7. Left arm numbness   Based on our discussion today I am going to refill his tramadol.  I have reviewed the [facility] practitioner database information.  He is using this on average 3-4 times a day and getting good relief with it.  No other side effects reported.  Also going to start him on gabapentin 100 mg tablets to be taken at bedtime.  He can increase this to 2 tablets to see if this can help with some of the neuralgic component  to his pain complex.  I have encouraged him to follow-up with his orthopedic doctor for the carpal tunnel syndrome evaluation and we will schedule him for a 68-month return to clinic.  We did talk about consideration for possibly a cervical epidural should his symptoms worsen but I do not feel this is indicated at this time.  Follow Up Instructions:    I discussed the assessment and treatment plan with the patient. The patient was provided an opportunity to ask questions and all were answered. The patient agreed with the plan and demonstrated an understanding of the instructions.   The patient was advised to call back or seek an in-person  evaluation if the symptoms worsen or if the condition fails to improve as anticipated.  I provided 30 minutes of non-face-to-face time during this encounter.   Yevette Edwards, MD

## 2020-07-19 ENCOUNTER — Telehealth: Payer: Self-pay

## 2020-07-19 NOTE — Telephone Encounter (Signed)
He needs to get a work note stating he was here last week, plus add in there that he has been a patient here for chronic pain since 2014. He has appt at [facility] at 10 and will stop by here after to pick up

## 2020-07-19 NOTE — Telephone Encounter (Signed)
Done

## 2020-09-22 IMAGING — CR DG LUMBAR SPINE COMPLETE 4+V
5 series · 5 of 5 positions shown · non-contrast
Comparison: None.

CLINICAL DATA: No specific injury. Low back pain radiating to the
right thigh today.

EXAM:
LUMBAR SPINE - COMPLETE 4+ VIEW

[l-spine ap]
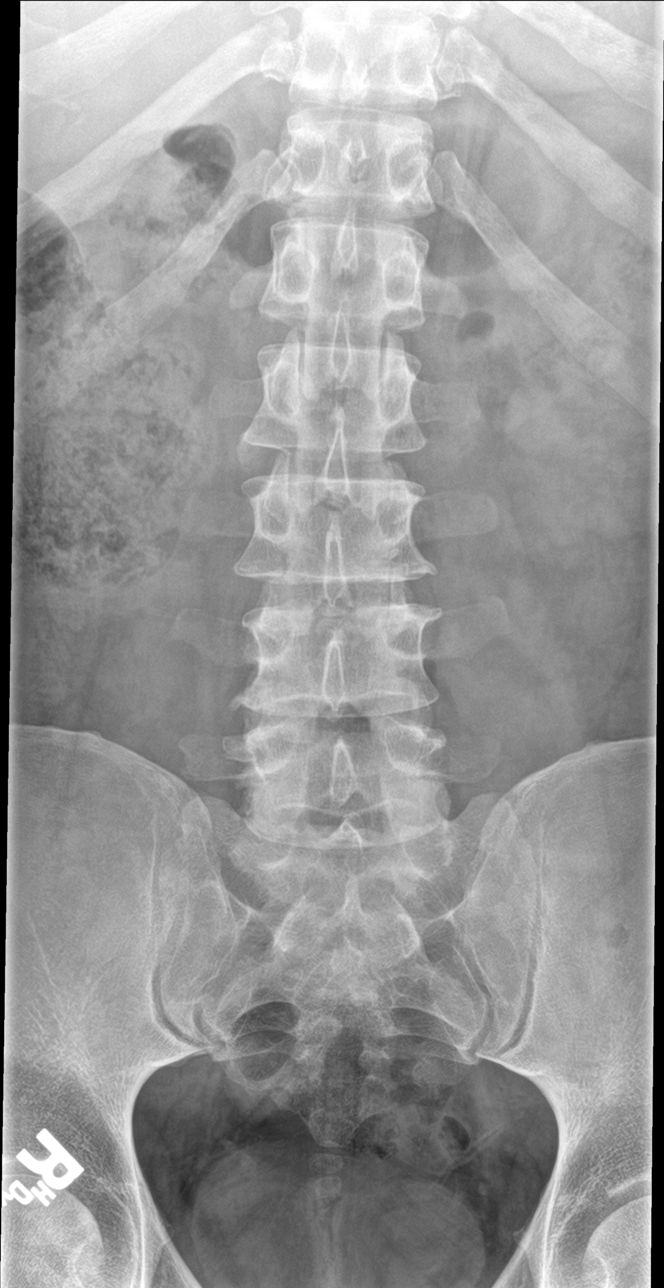

[l-spine obl (1 of 2)]
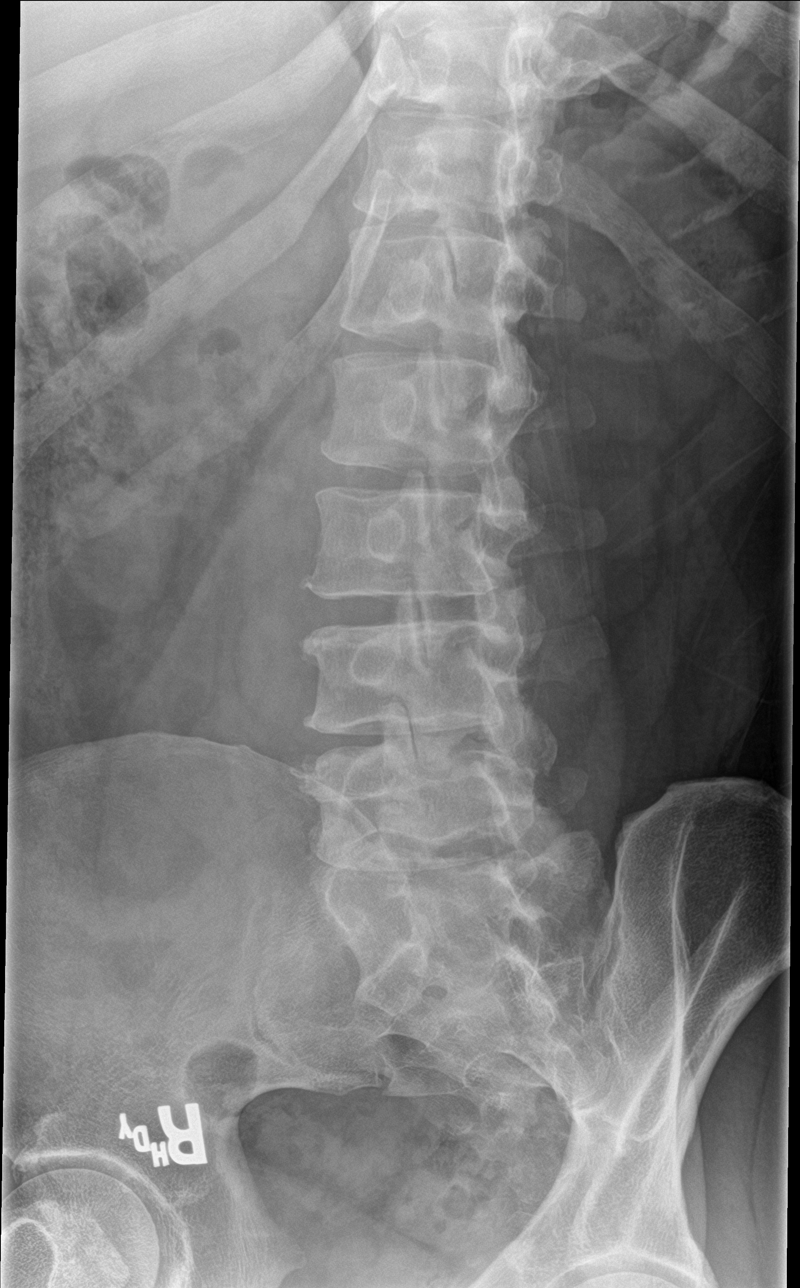

[l-spine obl (2 of 2)]
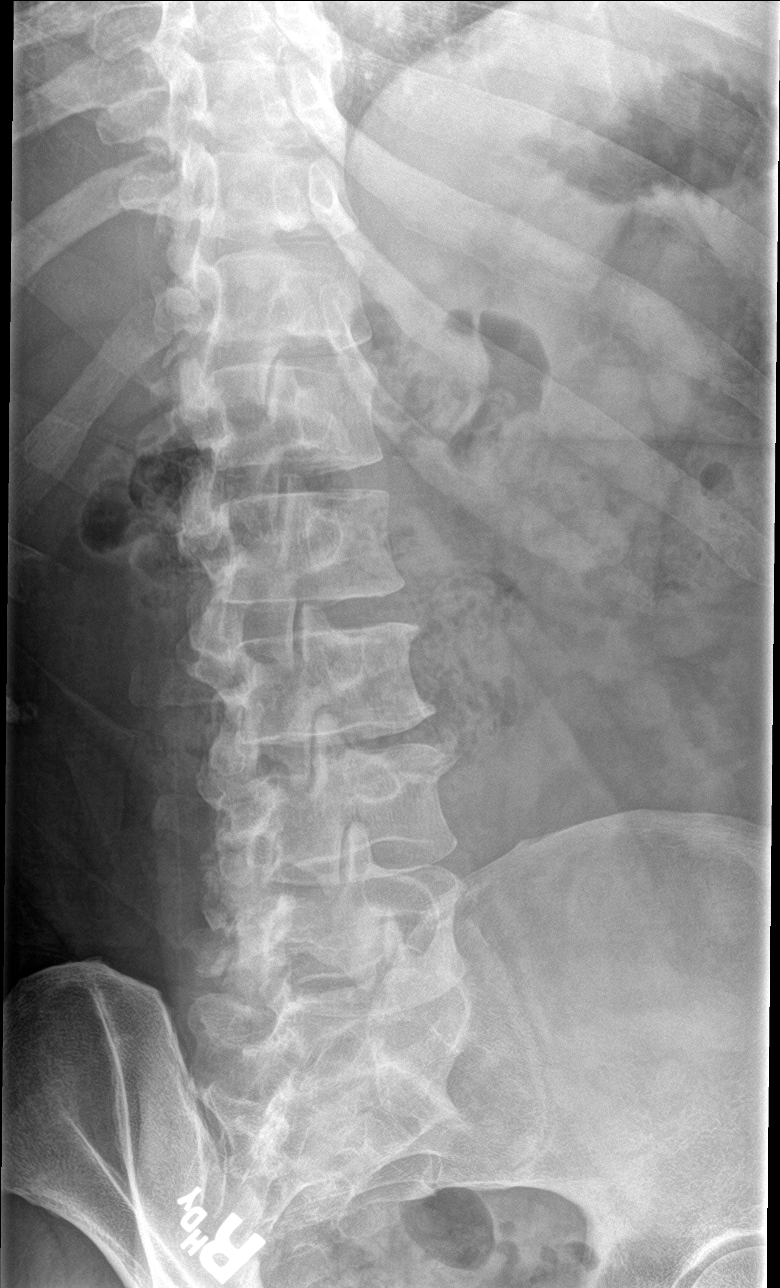

[l-spine lat]
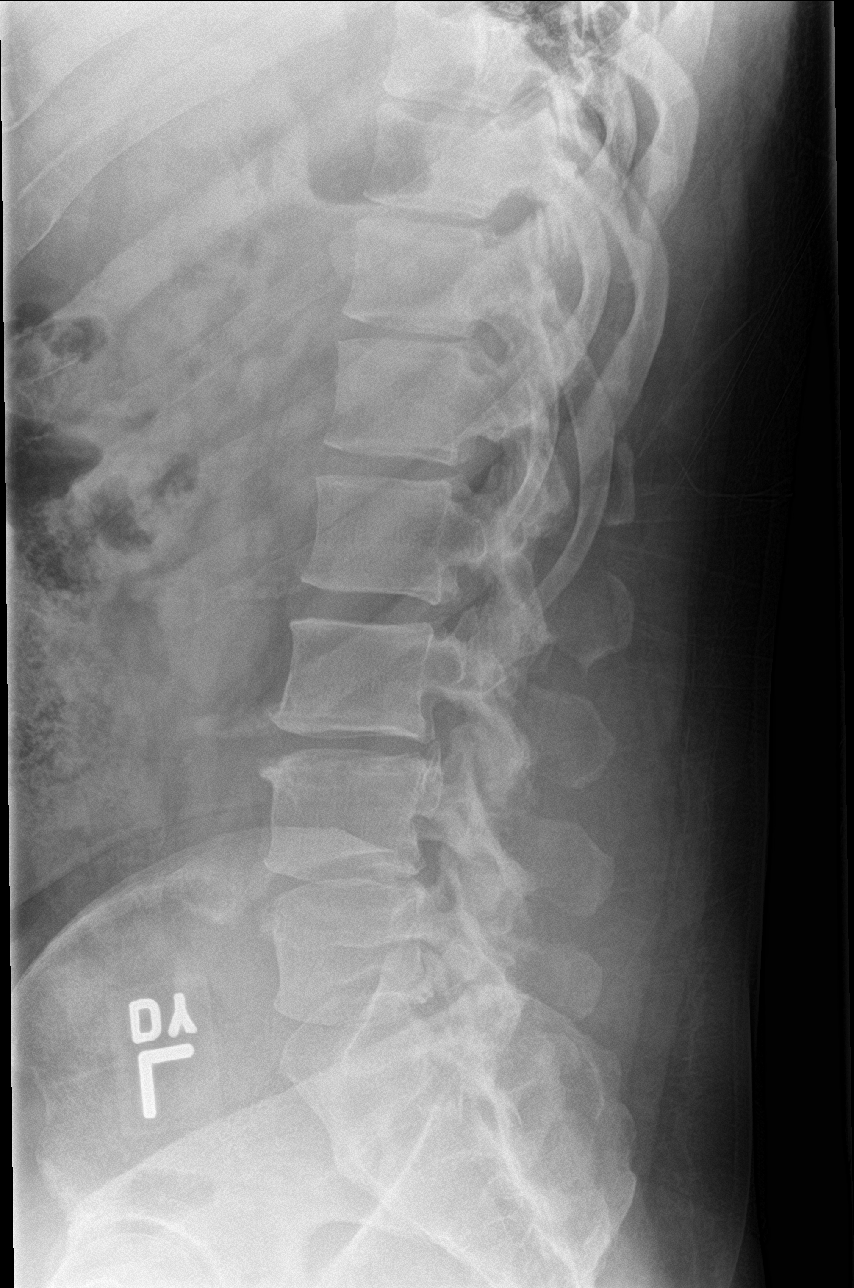

[l-spine spot]
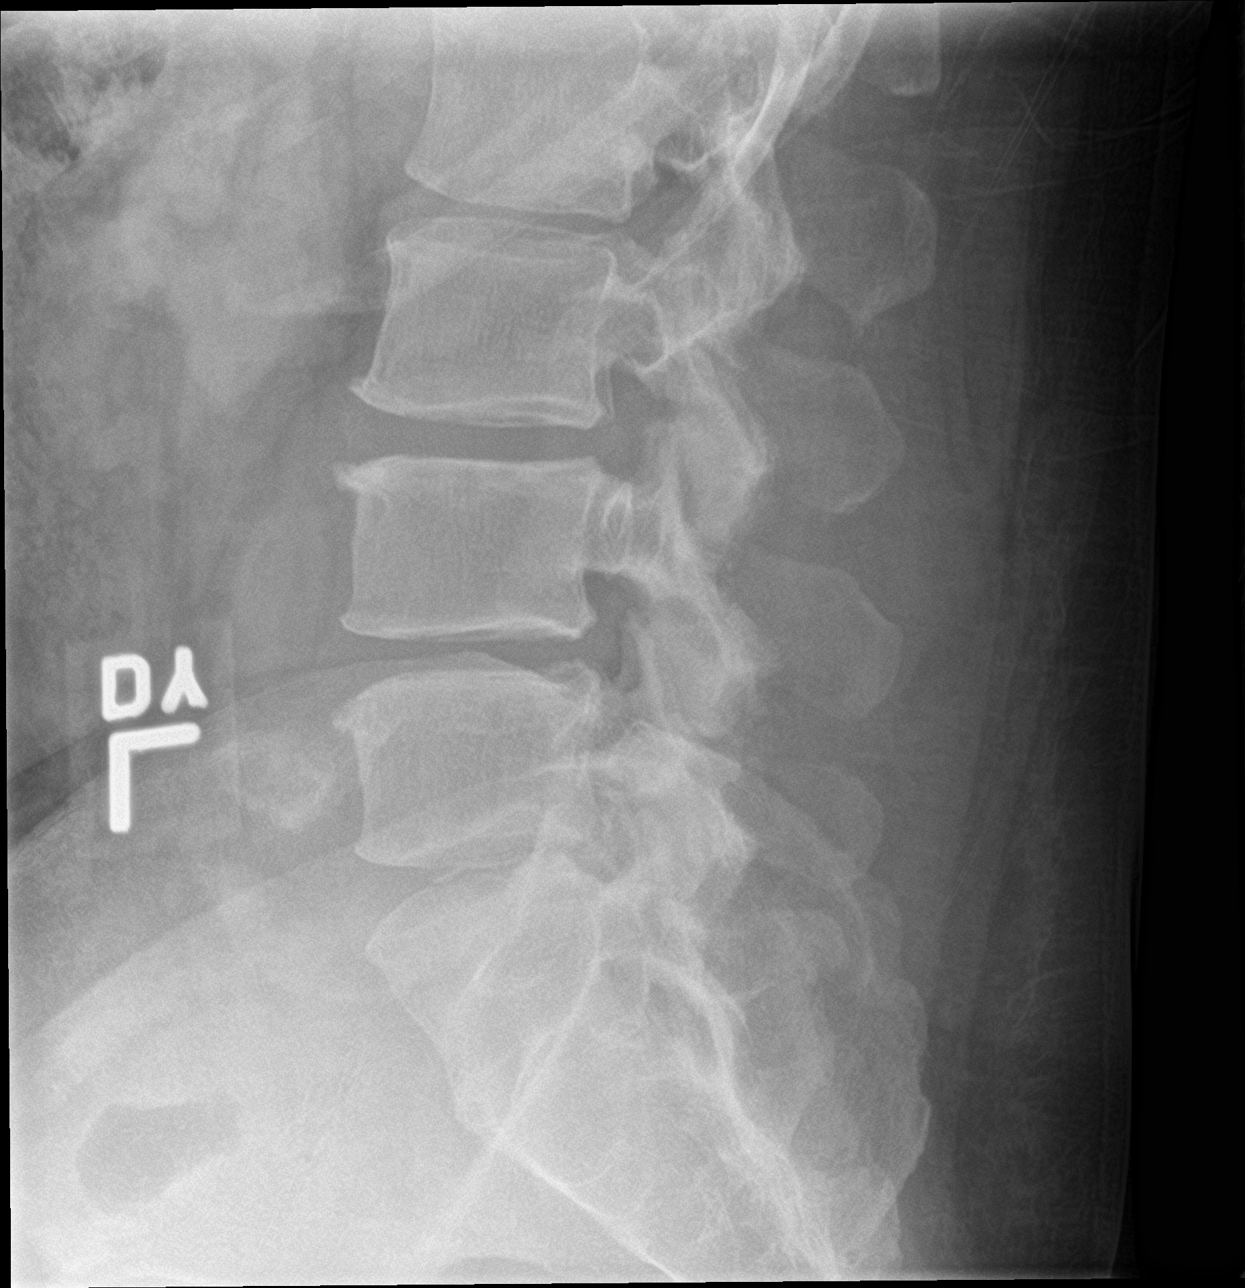

[5 of 5 positions shown; findings below may reference images not displayed]

FINDINGS: No fracture, bone lesion or spondylolisthesis. There is generalized
straightening of the normal lumbar lordosis.

Mild loss of disc height with endplate spurring at L3-L4. Moderate
loss of disc height at L4-L5 with small endplate spurs. Remaining
lumbar discs are well preserved. Narrowing of the right L4-L5 facet
joint. Remaining facet joints are well preserved.

Soft tissues are unremarkable.
IMPRESSION: 1. No fracture or acute finding.
2. Degenerative changes as described.

## 2020-10-23 ENCOUNTER — Other Ambulatory Visit: Payer: Self-pay | Admitting: Anesthesiology

## 2020-10-25 ENCOUNTER — Other Ambulatory Visit: Payer: Self-pay

## 2020-10-25 ENCOUNTER — Ambulatory Visit: Payer: 59 | Admitting: Anesthesiology

## 2020-10-27 ENCOUNTER — Telehealth (HOSPITAL_BASED_OUTPATIENT_CLINIC_OR_DEPARTMENT_OTHER): Payer: 59 | Admitting: Anesthesiology

## 2020-10-27 ENCOUNTER — Other Ambulatory Visit: Payer: Self-pay

## 2020-10-27 ENCOUNTER — Encounter: Payer: Self-pay | Admitting: Anesthesiology

## 2020-10-27 DIAGNOSIS — Z79891 Long term (current) use of opiate analgesic: Secondary | ICD-10-CM | POA: Diagnosis not present

## 2020-10-27 DIAGNOSIS — G894 Chronic pain syndrome: Secondary | ICD-10-CM

## 2020-10-27 DIAGNOSIS — M5030 Other cervical disc degeneration, unspecified cervical region: Secondary | ICD-10-CM

## 2020-10-27 DIAGNOSIS — M5412 Radiculopathy, cervical region: Secondary | ICD-10-CM | POA: Diagnosis not present

## 2020-10-27 DIAGNOSIS — M79641 Pain in right hand: Secondary | ICD-10-CM

## 2020-10-27 DIAGNOSIS — G8929 Other chronic pain: Secondary | ICD-10-CM

## 2020-10-27 DIAGNOSIS — M542 Cervicalgia: Secondary | ICD-10-CM

## 2020-10-27 DIAGNOSIS — R200 Anesthesia of skin: Secondary | ICD-10-CM

## 2020-10-27 MED ORDER — TRAMADOL HCL 50 MG PO TABS: 50.0000 mg | ORAL_TABLET | Freq: Four times a day (QID) | ORAL | 2 refills | Status: AC | PRN

## 2020-10-27 NOTE — Progress Notes (Signed)
Virtual Visit via Telephone Note  I connected with Carlos Gentry on 10/27/20 at 10:15 AM EST by telephone and verified that I am speaking with the correct person using two identifiers.  Location: Patient: Home Provider: Pain control center   I discussed the limitations, risks, security and privacy concerns of performing an evaluation and management service by telephone and the availability of in person appointments. I also discussed with the patient that there may be a patient responsible charge related to this service. The patient expressed understanding and agreed to proceed.   History of Present Illness: I spoke with Carlos Gentry via telephone today as he was unable to do the video portion of the virtual conference.  He states that he is doing reasonably well with his current pain situation but does continue to have days where he has severe incapacitating neck pain.  He is able to perform his work on most days however does have severe breakthrough pain on certain days which limits his ability to function and perform work effectively.  He is taking his medications as prescribed and these do give him good relief yet he does breakthrough on certain days.  Otherwise he is doing reasonably well.  His strength is stable his neck pain is consistent but generally well managed with the tramadol 50 mg tablets 4 times a day.  This is with no side effect from the medication.  He is still having some periodic carpal tunnel syndrome symptoms and underwent a steroid injection from Dr. Ernest Pine several months ago.  He got almost 3 to 4 months of complete relief from this but is having some mild to moderate recurrence periodically at this time.  Otherwise he is in his usual state of health at this time   Observations/Objective:  Current Outpatient Medications:  .  fluticasone (FLONASE) 50 MCG/ACT nasal spray, Place into both nostrils daily. prn, Disp: , Rfl:  .  gabapentin (NEURONTIN) 100 MG capsule, Take 1 capsule  (100 mg total) by mouth 2 (two) times daily., Disp: 60 capsule, Rfl: 2 .  HYDROcodone-acetaminophen (NORCO/VICODIN) 5-325 MG tablet, Take 1 tablet by mouth every 8 (eight) hours as needed for moderate pain., Disp: 15 tablet, Rfl: 0 .  meloxicam (MOBIC) 15 MG tablet, Take 15 mg by mouth as needed. , Disp: , Rfl:  .  omeprazole (PRILOSEC) 40 MG capsule, Take 40 mg by mouth 3 (three) times daily., Disp: , Rfl:  .  pravastatin (PRAVACHOL) 10 MG tablet, Take 10 mg by mouth daily., Disp: , Rfl:  .  PREVIDENT 5000 SENSITIVE 1.1-5 % PSTE, Take 1 application by mouth daily., Disp: , Rfl: 0 .  traMADol (ULTRAM) 50 MG tablet, Take 1 tablet (50 mg total) by mouth every 6 (six) hours as needed. TAKE 1 TABLET BY MOUTH EVERY 6 HOURS AS NEEDED FOR MODERATE PAIN, Disp: 120 tablet, Rfl: 2 Assessment and Plan: 1. Chronic pain syndrome   2. Cervical radiculitis   3. DDD (degenerative disc disease), cervical   4. Long term current use of opiate analgesic   5. Right hand pain   6. Chronic neck pain   7. Left arm numbness   Based on our discussion today and upon review of the [facility] practitioner database information I am going to refill his tramadol for the next 3 months.  As before 4 tablets/day as needed.  I encouraged him to continue with stretching strengthening exercises as reviewed for his neck and back.  He is planning on a repeat follow-up  with Dr. Ernest Pine for possible repeat carpal tunnel steroid injection as well.  It may be necessary for him to have certain days at work where he will need some relief from his job secondary to the paroxysmal neck pain that he continues to experience.  Otherwise I have encouraged him to continue with stretching strengthening exercises and physical therapy exercises but I do not feel any further interventional therapy is warranted at this time.  Follow Up Instructions:    I discussed the assessment and treatment plan with the patient. The patient was provided an  opportunity to ask questions and all were answered. The patient agreed with the plan and demonstrated an understanding of the instructions.   The patient was advised to call back or seek an in-person evaluation if the symptoms worsen or if the condition fails to improve as anticipated.  I provided 30 minut.des of non-face-to-face time during this encounter.   Yevette Edwards, MD

## 2021-01-31 ENCOUNTER — Ambulatory Visit: Payer: 59 | Attending: Anesthesiology | Admitting: Anesthesiology

## 2021-01-31 ENCOUNTER — Other Ambulatory Visit: Payer: Self-pay

## 2021-01-31 DIAGNOSIS — Z79891 Long term (current) use of opiate analgesic: Secondary | ICD-10-CM

## 2021-01-31 DIAGNOSIS — M79641 Pain in right hand: Secondary | ICD-10-CM

## 2021-01-31 DIAGNOSIS — G894 Chronic pain syndrome: Secondary | ICD-10-CM

## 2021-01-31 DIAGNOSIS — M5412 Radiculopathy, cervical region: Secondary | ICD-10-CM | POA: Diagnosis not present

## 2021-01-31 DIAGNOSIS — M5030 Other cervical disc degeneration, unspecified cervical region: Secondary | ICD-10-CM | POA: Diagnosis not present

## 2021-01-31 DIAGNOSIS — M542 Cervicalgia: Secondary | ICD-10-CM

## 2021-01-31 DIAGNOSIS — R200 Anesthesia of skin: Secondary | ICD-10-CM

## 2021-01-31 DIAGNOSIS — G8929 Other chronic pain: Secondary | ICD-10-CM

## 2021-01-31 MED ORDER — TRAMADOL HCL 50 MG PO TABS: 100.0000 mg | ORAL_TABLET | Freq: Three times a day (TID) | ORAL | 2 refills | Status: AC

## 2021-01-31 NOTE — Progress Notes (Signed)
Virtual Visit via Telephone Note  I connected with Carlos Gentry on 01/31/21 at  2:00 PM EDT by telephone and verified that I am speaking with the correct person using two identifiers.  Location: Patient: Home Provider: [facility] control center   I discussed the limitations, risks, security and privacy concerns of performing an evaluation and management service by telephone and the availability of in person appointments. I also discussed with the patient that there may be a patient responsible charge related to this service. The patient expressed understanding and agreed to proceed.   History of Present Illness: I spoke with Carlos Gentry over telephone today as he was unable to do the video portion of the virtual conference but he reports that he has been doing reasonably well with his neck pain and occasional back pain.  The quality and neck pain has been stable but slightly more intense this past week to the point where it has interfered with some of his work related activity..  He states that occasionally he does get some left upper extremity numbness associated with twisting of the neck and with increased activity and work.  He generally takes tramadol 1 tablet 4 times a day but does get insufficient relief depending on his activity.  He takes his gabapentin at nighttime specially when the numbness is more intense and this works well for him.  No weakness is reported in the upper extremities and the pain is generally well controlled with this regimen.  It is of the same quality characteristic and distribution as historically.  Otherwise he is in his usual state of health at this point.   Observations/Objective:  Current Outpatient Medications:  .  traMADol (ULTRAM) 50 MG tablet, Take 2 tablets (100 mg total) by mouth 3 (three) times daily., Disp: 180 tablet, Rfl: 2 .  fluticasone (FLONASE) 50 MCG/ACT nasal spray, Place into both nostrils daily. prn, Disp: , Rfl:  .  gabapentin (NEURONTIN) 100 MG  capsule, Take 1 capsule (100 mg total) by mouth 2 (two) times daily., Disp: 60 capsule, Rfl: 2 .  HYDROcodone-acetaminophen (NORCO/VICODIN) 5-325 MG tablet, Take 1 tablet by mouth every 8 (eight) hours as needed for moderate pain., Disp: 15 tablet, Rfl: 0 .  meloxicam (MOBIC) 15 MG tablet, Take 15 mg by mouth as needed. , Disp: , Rfl:  .  omeprazole (PRILOSEC) 40 MG capsule, Take 40 mg by mouth 3 (three) times daily., Disp: , Rfl:  .  pravastatin (PRAVACHOL) 10 MG tablet, Take 10 mg by mouth daily., Disp: , Rfl:  .  PREVIDENT 5000 SENSITIVE 1.1-5 % PSTE, Take 1 application by mouth daily., Disp: , Rfl: 0  Assessment and Plan: 1. Chronic pain syndrome   2. Cervical radiculitis   3. DDD (degenerative disc disease), cervical   4. Long term current use of opiate analgesic   5. Right hand pain   6. Chronic neck pain   7. Left arm numbness   I have reviewed the [facility] practitioner database information is appropriate for refill.  Based on the severity of his pain and the fact that the tramadol helps but is giving inconsistent or insufficient relief I am going to increase his dose to 2 tablets 3 times a day.  He can dose these accordingly based on his work schedule.  We have talked about the risk for sedation associated with the tramadol but he has been on this for a considerable period of time and without side effect.  We have talked about 1  tablet in the morning to after work and perhaps 2 in the evening if need be but he can go up to 2 tablets 3 times a day.  He can continue with the gabapentin at bedtime as well.  We talked about stretching strengthening exercises previously and I will schedule him for return to clinic in 3 months.  He is to continue follow-up with his primary care physicians for his baseline medical care.  Follow Up Instructions:    I discussed the assessment and treatment plan with the patient. The patient was provided an opportunity to ask questions and all were answered.  The patient agreed with the plan and demonstrated an understanding of the instructions.   The patient was advised to call back or seek an in-person evaluation if the symptoms worsen or if the condition fails to improve as anticipated.  I provided 30 minutes of non-face-to-face time during this encounter.   Yevette Edwards, MD

## 2021-02-01 ENCOUNTER — Telehealth: Payer: Self-pay | Admitting: Anesthesiology

## 2021-02-01 NOTE — Telephone Encounter (Signed)
Dr. 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 Note upfront for patient pickup and copy sent to scan.

## 2021-02-01 NOTE — Telephone Encounter (Signed)
Patient called. Issue resolved.

## 2021-02-01 NOTE — Telephone Encounter (Signed)
I have tried to contact Dr. Pernell Dupre to confirm the work note and dates.Marland Kitchen LVM and text for Adams to return call.

## 2021-02-01 NOTE — Telephone Encounter (Signed)
Arius, Harn[place] called back and said he gave you wrong dates to put on work note Says it needs to be April 28 thru May 5th. He Program[employer], applications.

## 2021-02-05 ENCOUNTER — Telehealth: Payer: Self-pay

## 2021-02-05 NOTE — Telephone Encounter (Signed)
He needs a new work note for 4/28-59. He said the other was not acceptable to his work place.

## 2021-06-13 ENCOUNTER — Telehealth: Payer: Self-pay

## 2021-06-14 ENCOUNTER — Other Ambulatory Visit: Payer: Self-pay

## 2021-06-14 ENCOUNTER — Ambulatory Visit: Payer: 59 | Attending: Anesthesiology | Admitting: Anesthesiology

## 2021-06-14 DIAGNOSIS — G894 Chronic pain syndrome: Secondary | ICD-10-CM | POA: Diagnosis not present

## 2021-06-14 DIAGNOSIS — Z79891 Long term (current) use of opiate analgesic: Secondary | ICD-10-CM | POA: Diagnosis not present

## 2021-06-14 DIAGNOSIS — M5030 Other cervical disc degeneration, unspecified cervical region: Secondary | ICD-10-CM | POA: Diagnosis not present

## 2021-06-14 DIAGNOSIS — M5412 Radiculopathy, cervical region: Secondary | ICD-10-CM | POA: Diagnosis not present

## 2021-06-14 DIAGNOSIS — R200 Anesthesia of skin: Secondary | ICD-10-CM

## 2021-06-14 DIAGNOSIS — M79641 Pain in right hand: Secondary | ICD-10-CM

## 2021-06-14 MED ORDER — TRAMADOL HCL 50 MG PO TABS: 50.0000 mg | ORAL_TABLET | ORAL | 2 refills | Status: AC

## 2021-06-14 NOTE — Progress Notes (Signed)
Virtual Visit via Telephone Note  I connected with Carlos Gentry on 06/14/21 at  2:55 PM EDT by telephone and verified that I am speaking with the correct person using two identifiers.  Location: Patient: Home Provider: Pain control center   I discussed the limitations, risks, security and privacy concerns of performing an evaluation and management service by telephone and the availability of in person appointments. I also discussed with the patient that there may be a patient responsible charge related to this service. The patient expressed understanding and agreed to proceed.   History of Present Illness: I spoke with Carlos Gentry today via telephone as we were unable link for the video portion of the virtual conference.  He states that overall his neck pain is responding well to the tramadol.  He is doing his exercises as tolerated and his left arm numbness has improved since our last conversation.  He is not sure what to account for regarding this.  He feels that he may have strained the neck and that overall he is doing fewer activities that may be straining the neck.  Otherwise he is in his usual state of health.  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  He is taking tramadol approximately 5 times a day without side effect and getting good relief from it.  No change in upper extremity strength or bowel or bladder function is noted.  Review of systems: General: No fevers or chills Pulmonary: No shortness of breath or dyspnea Cardiac: No angina or palpitations or lightheadedness GI: No abdominal pain or constipation Psych: No depression    Observations/Objective:  Current Outpatient Medications:    traMADol (ULTRAM) 50 MG tablet, Take 1 tablet (50 mg total) by mouth every 4 (four) hours., Disp: 150 tablet, Rfl: 2   fluticasone (FLONASE) 50 MCG/ACT nasal spray, Place into both nostrils daily. prn, Disp: , Rfl:    gabapentin (NEURONTIN) 100 MG capsule, Take 1  capsule (100 mg total) by mouth 2 (two) times daily., Disp: 60 capsule, Rfl: 2   HYDROcodone-acetaminophen (NORCO/VICODIN) 5-325 MG tablet, Take 1 tablet by mouth every 8 (eight) hours as needed for moderate pain., Disp: 15 tablet, Rfl: 0   meloxicam (MOBIC) 15 MG tablet, Take 15 mg by mouth as needed. , Disp: , Rfl:    omeprazole (PRILOSEC) 40 MG capsule, Take 40 mg by mouth 3 (three) times daily., Disp: , Rfl:    pravastatin (PRAVACHOL) 10 MG tablet, Take 10 mg by mouth daily., Disp: , Rfl:    PREVIDENT 5000 SENSITIVE 1.1-5 % PSTE, Take 1 application by mouth daily., Disp: , Rfl: 0   Assessment and Plan: 1. Chronic pain syndrome   2. Cervical radiculitis   3. DDD (degenerative disc disease), cervical   4. Long term current use of opiate analgesic   5. Right hand pain   6. Left arm numbness   Based on her discussion today and asked appropriate refill his medications for the next 3 months.  This will be for tramadol 50 mg tablets to be taken every 4 hours as needed.  150 tablets/month and we will schedule him for a 3-month return.  I talked him about some exercises and stretching for his neck.  No other changes in his pain management protocol are initiated today.  He is to continue follow-up with his primary care physicians for his baseline medical care.  Follow Up Instructions:    I discussed the assessment and treatment plan with  the patient. The patient was provided an opportunity to ask questions and all were answered. The patient agreed with the plan and demonstrated an understanding of the instructions.   The patient was advised to call back or seek an in-person evaluation if the symptoms worsen or if the condition fails to improve as anticipated.  I provided 30 minutes of non-face-to-face time during this encounter.   Yevette Edwards, MD

## 2021-08-27 ENCOUNTER — Other Ambulatory Visit: Payer: Self-pay

## 2021-08-27 ENCOUNTER — Encounter: Payer: Self-pay | Admitting: Anesthesiology

## 2021-08-27 ENCOUNTER — Ambulatory Visit: Payer: 59 | Attending: Anesthesiology | Admitting: Anesthesiology

## 2021-08-27 DIAGNOSIS — M79641 Pain in right hand: Secondary | ICD-10-CM

## 2021-08-27 DIAGNOSIS — G894 Chronic pain syndrome: Secondary | ICD-10-CM | POA: Diagnosis not present

## 2021-08-27 DIAGNOSIS — M542 Cervicalgia: Secondary | ICD-10-CM

## 2021-08-27 DIAGNOSIS — Z79891 Long term (current) use of opiate analgesic: Secondary | ICD-10-CM

## 2021-08-27 DIAGNOSIS — M5030 Other cervical disc degeneration, unspecified cervical region: Secondary | ICD-10-CM

## 2021-08-27 DIAGNOSIS — M5412 Radiculopathy, cervical region: Secondary | ICD-10-CM

## 2021-08-27 DIAGNOSIS — R200 Anesthesia of skin: Secondary | ICD-10-CM

## 2021-08-27 DIAGNOSIS — G8929 Other chronic pain: Secondary | ICD-10-CM

## 2021-08-27 MED ORDER — GABAPENTIN 100 MG PO CAPS: 100.0000 mg | ORAL_CAPSULE | Freq: Two times a day (BID) | ORAL | 2 refills | Status: DC

## 2021-08-27 MED ORDER — TRAMADOL HCL 50 MG PO TABS: 100.0000 mg | ORAL_TABLET | Freq: Three times a day (TID) | ORAL | 2 refills | Status: AC

## 2021-08-27 NOTE — Progress Notes (Signed)
Virtual Visit via Telephone Note  I connected with Carlos Gentry on 08/27/21 at  1:00 PM EST by telephone and verified that I am speaking with the correct person using two identifiers.  Location: Patient: Home Provider: Pain control center   I discussed the limitations, risks, security and privacy concerns of performing an evaluation and management service by telephone and the availability of in person appointments. I also discussed with the patient that there may be a patient responsible charge related to this service. The patient expressed understanding and agreed to proceed.   History of Present Illness: I spoke with Carlos Gentry via telephone as we were unable like for the video portion of this virtual.  He reports that he is having a recent significant increase in his neck pain and some associated right hand numbness and tingling.  No significant weakness is reported but this is work-related and has been exacerbated by some of the heavier lifting he has been doing at work recently.  Despite efforts with his current medication management this pain has persisted.  He has tried to back off for the last 3 days and this is helped somewhat.  He is taking his tramadol approximately 2 tablets 3 times a day on average and this helps with pain relief but the neck and arm pain have been persistent.  Otherwise he is not using routine anti-inflammatories and has been off of his gabapentin for some time.  The quality characteristic and distribution of his pain is stable in nature he reports.  Review of systems: General: No fevers or chills Pulmonary: No shortness of breath or dyspnea Cardiac: No angina or palpitations or lightheadedness GI: No abdominal pain or constipation Psych: No depression    Observations/Objective:   Current Outpatient Medications:    fluticasone (FLONASE) 50 MCG/ACT nasal spray, Place into both nostrils daily. prn, Disp: , Rfl:    gabapentin (NEURONTIN) 100 MG capsule, Take 1  capsule (100 mg total) by mouth 2 (two) times daily., Disp: 60 capsule, Rfl: 2   HYDROcodone-acetaminophen (NORCO/VICODIN) 5-325 MG tablet, Take 1 tablet by mouth every 8 (eight) hours as needed for moderate pain., Disp: 15 tablet, Rfl: 0   meloxicam (MOBIC) 15 MG tablet, Take 15 mg by mouth as needed. , Disp: , Rfl:    omeprazole (PRILOSEC) 40 MG capsule, Take 40 mg by mouth 3 (three) times daily., Disp: , Rfl:    pravastatin (PRAVACHOL) 10 MG tablet, Take 10 mg by mouth daily., Disp: , Rfl:    PREVIDENT 5000 SENSITIVE 1.1-5 % PSTE, Take 1 application by mouth daily., Disp: , Rfl: 0   traMADol (ULTRAM) 50 MG tablet, Take 2 tablets (100 mg total) by mouth in the morning, at noon, and at bedtime., Disp: 150 tablet, Rfl: 2  Assessment and Plan:  1. Chronic pain syndrome   2. Cervical radiculitis   3. DDD (degenerative disc disease), cervical   4. Long term current use of opiate analgesic   5. Right hand pain   6. Left arm numbness   7. Chronic neck pain   Based on our discussion today I think it is appropriate to continue with the tramadol and I have reviewed the [facility] practitioner database information is appropriate for refill.  I want him to restart his gabapentin 100 mg at bedtime and a tablet in the morning if necessary for the persistent neuritis symptoms he is experiencing have encouraged him to continue with either meloxicam or Naprosyn as an anti-inflammatory to help with  the underlying inflammation.  We talked about efforts to reduce certain activity and heavy lifting.  He has requested a work excuse for the last few days and I think this is appropriate.  I want him to avoid as much heavy lifting over the next few days as he can to help get the inflammation under better control.  No other changes will be initiated his pain management protocol with return to clinic scheduled in 3 months and request to continue follow-up with his primary care physicians for his baseline medical  care. Follow Up Instructions:    I discussed the assessment and treatment plan with the patient. The patient was provided an opportunity to ask questions and all were answered. The patient agreed with the plan and demonstrated an understanding of the instructions.   The patient was advised to call back or seek an in-person evaluation if the symptoms worsen or if the condition fails to improve as anticipated.  I provided 30 minutes of non-face-to-face time during this encounter.   Yevette Edwards, MD

## 2021-10-29 ENCOUNTER — Other Ambulatory Visit: Payer: Self-pay | Admitting: Anesthesiology

## 2021-12-25 ENCOUNTER — Other Ambulatory Visit: Payer: Self-pay | Admitting: Anesthesiology

## 2021-12-31 ENCOUNTER — Ambulatory Visit: Payer: 59 | Attending: Anesthesiology | Admitting: Anesthesiology

## 2021-12-31 ENCOUNTER — Encounter: Payer: Self-pay | Admitting: Anesthesiology

## 2021-12-31 DIAGNOSIS — Z79891 Long term (current) use of opiate analgesic: Secondary | ICD-10-CM

## 2021-12-31 DIAGNOSIS — R200 Anesthesia of skin: Secondary | ICD-10-CM

## 2021-12-31 DIAGNOSIS — M5030 Other cervical disc degeneration, unspecified cervical region: Secondary | ICD-10-CM

## 2021-12-31 DIAGNOSIS — G894 Chronic pain syndrome: Secondary | ICD-10-CM

## 2021-12-31 DIAGNOSIS — M79641 Pain in right hand: Secondary | ICD-10-CM

## 2021-12-31 DIAGNOSIS — M5412 Radiculopathy, cervical region: Secondary | ICD-10-CM | POA: Diagnosis not present

## 2021-12-31 DIAGNOSIS — M542 Cervicalgia: Secondary | ICD-10-CM

## 2021-12-31 DIAGNOSIS — G8929 Other chronic pain: Secondary | ICD-10-CM

## 2021-12-31 MED ORDER — TRAMADOL HCL 50 MG PO TABS: 100.0000 mg | ORAL_TABLET | Freq: Three times a day (TID) | ORAL | 0 refills | Status: AC

## 2022-01-01 ENCOUNTER — Encounter: Payer: Self-pay | Admitting: Anesthesiology

## 2022-01-01 NOTE — Progress Notes (Signed)
Virtual Visit via Telephone Note ? ?I connected with [name] on 01/01/22 at  2:05 PM EDT by telephone and verified that I am speaking with the correct person using two identifiers. ? ?Location: ?Patient: Home ?Provider: Pain control center ?  ?I discussed the limitations, risks, security and privacy concerns of performing an evaluation and management service by telephone and the availability of in person appointments. I also discussed with the patient that there may be a patient responsible charge related to this service. The patient expressed understanding and agreed to proceed. ? ? ?History of Present Illness: ?I had a long conversation with Leitha Bleak today via telephone as we were unable link for the video portion of the conference but he states that he has been doing well with his neck and back pain.  As long as he is reasonably active and does not do the extensive work he is done previously he does okay.  He takes his medications as prescribed and these continue to work well for him and keep his pain under good control.  No side effects with his tramadol are noted.  He generally takes 2 tablets 3 times a day.  He gets about 75% improvement with the medications lasting about 4 to 6 hours.  Upper and lower extremity strength and function bowel bladder function been stable as well. ? ?Review of systems: ?General: No fevers or chills ?Pulmonary: No shortness of breath or dyspnea ?Cardiac: No angina or palpitations or lightheadedness ?GI: No abdominal pain or constipation ?Psych: No depression  ?  ?Observations/Objective: ? ?Current Outpatient Medications:  ?  traMADol (ULTRAM) 50 MG tablet, Take 2 tablets (100 mg total) by mouth 3 (three) times daily., Disp: 180 tablet, Rfl: 0 ?  fluticasone (FLONASE) 50 MCG/ACT nasal spray, Place into both nostrils daily. prn, Disp: , Rfl:  ?  gabapentin (NEURONTIN) 100 MG capsule, Take 1 capsule (100 mg total) by mouth 2 (two) times daily., Disp: 60 capsule, Rfl: 2 ?   HYDROcodone-acetaminophen (NORCO/VICODIN) 5-325 MG tablet, Take 1 tablet by mouth every 8 (eight) hours as needed for moderate pain., Disp: 15 tablet, Rfl: 0 ?  meloxicam (MOBIC) 15 MG tablet, Take 15 mg by mouth as needed. , Disp: , Rfl:  ?  omeprazole (PRILOSEC) 40 MG capsule, Take 40 mg by mouth 3 (three) times daily., Disp: , Rfl:  ?  pravastatin (PRAVACHOL) 10 MG tablet, Take 10 mg by mouth daily., Disp: , Rfl:  ?  PREVIDENT 5000 SENSITIVE 1.1-5 % PSTE, Take 1 application by mouth daily., Disp: , Rfl: 0  ? ?Past Medical History:  ?Diagnosis Date  ? Fatty liver   ? 11/18  ? GERD (gastroesophageal reflux disease)   ? Hypercholesteremia   ? Left arm numbness 07/11/2020  ? Right hand pain 03/27/2020  ? Tonsillitis   ? 4/18  ?  ? ?Assessment and Plan: ?1. Chronic pain syndrome   ?2. Cervical radiculitis   ?3. DDD (degenerative disc disease), cervical   ?4. Long term current use of opiate analgesic   ?5. Right hand pain   ?6. Left arm numbness   ?7. Chronic neck pain   ?Based on our discussion today and after review of the [facility] practitioner database information it is appropriate to refill his medicines.  I think he is on a good regimen with the tramadol.  We will schedule this for 2 tablets 3 times a day with no other changes initiated.  Continue core stretching strengthening exercises in addition he  can add in Naprosyn to 20 twice a day.  Continue follow-up with his primary care physicians for baseline medical care scheduled for return with [place] in 3 months ? ?Follow Up Instructions: ? ?  ?I discussed the assessment and treatment plan with the patient. The patient was provided an opportunity to ask questions and all were answered. The patient agreed with the plan and demonstrated an understanding of the instructions. ?  ?The patient was advised to call back or seek an in-person evaluation if the symptoms worsen or if the condition fails to improve as anticipated. ? ?I provided.  30 minutes of non-face-to-face  time during this encounter. ? ? ?Molli Barrows, MD  ?

## 2022-02-02 ENCOUNTER — Other Ambulatory Visit: Payer: Self-pay | Admitting: Anesthesiology

## 2022-02-06 ENCOUNTER — Telehealth: Payer: Self-pay | Admitting: Anesthesiology

## 2022-02-06 NOTE — Telephone Encounter (Signed)
Patient states he called the pharmacy to fill his script and was told they do not have a script to fill. Patient was here in April and should have had a script for April and May. Pharmacy says there is no may script. Please verify and let patient know what to do. ?

## 2022-02-07 ENCOUNTER — Other Ambulatory Visit: Payer: Self-pay | Admitting: Student in an Organized Health Care Education/Training Program

## 2022-02-07 ENCOUNTER — Other Ambulatory Visit: Payer: Self-pay | Admitting: *Deleted

## 2022-02-07 DIAGNOSIS — G894 Chronic pain syndrome: Secondary | ICD-10-CM

## 2022-02-07 MED ORDER — TRAMADOL HCL 50 MG PO TABS: 100.0000 mg | ORAL_TABLET | Freq: Three times a day (TID) | ORAL | 0 refills | Status: AC | PRN

## 2022-02-07 NOTE — Telephone Encounter (Signed)
Done and patient made aware  

## 2022-02-07 NOTE — Progress Notes (Unsigned)
This is a patient of my colleague, Dr. Andree Elk.  Refill request for tramadol. ?

## 2022-02-07 NOTE — Progress Notes (Signed)
Script sent per Dr. Cherylann Ratel after a verbal OK from Dr. Pernell Dupre via phone. Patient called and informed. ?

## 2022-03-09 ENCOUNTER — Other Ambulatory Visit: Payer: Self-pay | Admitting: Student in an Organized Health Care Education/Training Program

## 2022-03-09 DIAGNOSIS — G894 Chronic pain syndrome: Secondary | ICD-10-CM

## 2022-03-15 ENCOUNTER — Telehealth: Payer: Self-pay | Admitting: Anesthesiology

## 2022-03-15 NOTE — Telephone Encounter (Signed)
Patient states his tramadol script is not right. Need to send in corrected script so he can get filled

## 2022-03-15 NOTE — Telephone Encounter (Signed)
Informed patient that he has no prescriptions to be filled.  He should have had an appointment scheduled around 03-02-2022.  Instructed him to call to set up an appointment.

## 2022-03-21 ENCOUNTER — Encounter: Payer: Self-pay | Admitting: Anesthesiology

## 2022-03-21 ENCOUNTER — Ambulatory Visit: Payer: 59 | Attending: Anesthesiology | Admitting: Anesthesiology

## 2022-03-21 VITALS — BP 130/89 | HR 90 | Temp 97.7°F | Resp 16 | Ht 67.0 in | Wt 230.0 lb

## 2022-03-21 DIAGNOSIS — M542 Cervicalgia: Secondary | ICD-10-CM

## 2022-03-21 DIAGNOSIS — G894 Chronic pain syndrome: Secondary | ICD-10-CM | POA: Insufficient documentation

## 2022-03-21 DIAGNOSIS — M5010 Cervical disc disorder with radiculopathy, unspecified cervical region: Secondary | ICD-10-CM | POA: Diagnosis not present

## 2022-03-21 DIAGNOSIS — M5030 Other cervical disc degeneration, unspecified cervical region: Secondary | ICD-10-CM

## 2022-03-21 DIAGNOSIS — M5412 Radiculopathy, cervical region: Secondary | ICD-10-CM

## 2022-03-21 DIAGNOSIS — R200 Anesthesia of skin: Secondary | ICD-10-CM

## 2022-03-21 DIAGNOSIS — M4726 Other spondylosis with radiculopathy, lumbar region: Secondary | ICD-10-CM | POA: Insufficient documentation

## 2022-03-21 DIAGNOSIS — M79641 Pain in right hand: Secondary | ICD-10-CM | POA: Diagnosis not present

## 2022-03-21 DIAGNOSIS — Z79891 Long term (current) use of opiate analgesic: Secondary | ICD-10-CM | POA: Diagnosis not present

## 2022-03-21 DIAGNOSIS — Z79899 Other long term (current) drug therapy: Secondary | ICD-10-CM | POA: Diagnosis not present

## 2022-03-21 DIAGNOSIS — G8929 Other chronic pain: Secondary | ICD-10-CM

## 2022-03-21 MED ORDER — GABAPENTIN 100 MG PO CAPS: 100.0000 mg | ORAL_CAPSULE | Freq: Two times a day (BID) | ORAL | 2 refills | Status: DC

## 2022-03-21 MED ORDER — MELOXICAM 15 MG PO TABS: 15.0000 mg | ORAL_TABLET | ORAL | 5 refills | Status: AC | PRN

## 2022-03-21 MED ORDER — TRAMADOL HCL 50 MG PO TABS: 100.0000 mg | ORAL_TABLET | Freq: Three times a day (TID) | ORAL | 2 refills | Status: DC

## 2022-03-21 NOTE — Progress Notes (Signed)
Subjective:  Patient ID: Carlos Gentry, male    DOB: 05-17-1966  Age: 56 y.o. MRN: [id number]  CC: Neck Pain   Procedure: None  HPI Carlos Gentry presents for reevaluation.  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  He has been seen by orthopedics and may be looking at a possible carpal decompression pression.  Regards to his neck pain it is generally well controlled with 2 of the tramadol tablets 3 times a day and this keeps him functional and active and working full-time.  He occasionally uses gabapentin 100 mg twice a day and meloxicam and this combination of medications keeps him functional and active.  No other changes in upper extremity strength or function or bowel or bladder function are noted at this time and he does well with the medications and no side effects reported.  Outpatient Medications Prior to Visit  Medication Sig Dispense Refill   fluticasone (FLONASE) 50 MCG/ACT nasal spray Place into both nostrils daily. prn     omeprazole (PRILOSEC) 40 MG capsule Take 40 mg by mouth 3 (three) times daily.     pravastatin (PRAVACHOL) 10 MG tablet Take 10 mg by mouth daily.     PREVIDENT 5000 SENSITIVE 1.1-5 % PSTE Take 1 application by mouth daily.  0   meloxicam (MOBIC) 15 MG tablet Take 15 mg by mouth as needed.      lisinopril (ZESTRIL) 10 MG tablet Take 10 mg by mouth daily.     gabapentin (NEURONTIN) 100 MG capsule Take 1 capsule (100 mg total) by mouth 2 (two) times daily. 60 capsule 2   No facility-administered medications prior to visit.    Review of Systems CNS: No confusion or sedation Cardiac: No angina or palpitations GI: No abdominal pain or constipation Constitutional: No nausea vomiting fevers or chills  Objective:  BP 130/89   Pulse 90   Temp 97.7 F (36.5 C) (Temporal)   Resp 16    Ht 5\' 7"  (1.702 m)   Wt 230 lb (104.3 kg)   SpO2 99%   BMI 36.02 kg/m    BP Readings from Last 3 Encounters:  03/21/22 130/89  08/01/19 (!) 145/80  06/18/19 118/83     Wt Readings from Last 3 Encounters:  03/21/22 230 lb (104.3 kg)  08/01/19 230 lb (104.3 kg)  06/18/19 235 lb (106.6 kg)     Physical Exam Pt is alert and oriented PERRL EOMI HEART IS RRR no murmur or rub LCTA no wheezing or rales MUSCULOSKELETAL reveals good grip strength bilateral with no evidence of deficit good wrist strength and bicep tricep strength is excellent.  Muscle tone and bulk is outstanding.  Good strength of the glenohumeral joint and range of motion of the lateral cervical joint.  Some tenderness about the paraspinous muscles and spinous capitis.  Labs  No results found for: "HGBA1C" Lab Results  Component Value Date   CREATININE 0.98 06/18/2019    -------------------------------------------------------------------------------------------------------------------- Lab Results  Component Value Date   WBC 3.3 (L) 06/18/2019   HGB 14.1 06/18/2019   HCT 42.8 06/18/2019   PLT 234 06/18/2019   GLUCOSE 119 (H) 06/18/2019   NA 140 06/18/2019   K 4.5 06/18/2019   CL 108 06/18/2019   CREATININE 0.98 06/18/2019   BUN 14 06/18/2019   CO2 24 06/18/2019    ---------------------------------------------------------------------------------------------------------------------  MR LUMBAR SPINE WO CONTRAST  Result Date: 08/19/2019 CLINICAL DATA:  Lumbar radiculopathy EXAM: MRI LUMBAR SPINE WITHOUT CONTRAST TECHNIQUE: Multiplanar, multisequence MR imaging of the lumbar spine was performed. No intravenous contrast was administered. COMPARISON:  Multiple exams, including CT abdomen 08/01/2019 and lumbar spine radiographs from 08/01/2019 FINDINGS: Segmentation: Rudimentary disc material at L5-S1. The lowest fully segmental lumbar type non-rib-bearing vertebra is labeled L5. Alignment:  4 mm degenerative  retrolisthesis at L5-S1. Vertebrae: Disc desiccation at all levels between L3 and S1 with moderate loss of disc height. Mild degenerative endplate findings at [id number]. 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 Conus medullaris and cauda equina: Conus extends to the L1-2 level. Conus and cauda equina appear normal. Paraspinal and other soft tissues: A fluid signal intensity lesion of the left kidney is partially characterized on today's exam. This is statistically likely to be a cyst but not technically specific. Right extrarenal pelvis. Disc levels: L1-2: Unremarkable. L2-3: Moderate displacement of the right L2 nerve in the lateral extraforaminal space due to a right lateral extraforaminal disc protrusion. Mild disc bulge. L3-4: Moderate central narrowing of the thecal sac with mild bilateral subarticular lateral recess stenosis due to disc bulge, intervertebral spurring, and short pedicles. L4-5: Prominent central narrowing of the thecal sac with mild bilateral subarticular lateral recess stenosis and mild bilateral foraminal stenosis due to short pedicles, disc bulge, intervertebral spurring, and facet spurring. AP diameter of the thecal sac is 0.6 cm. L5-S1: Moderate left and mild to moderate right foraminal stenosis with moderate central narrowing of the thecal sac, and moderate bilateral subarticular lateral recess stenosis due to right paracentral disc protrusion, disc bulge, intervertebral spurring, facet arthropathy, and short pedicles. IMPRESSION: 1. Lumbar spondylosis, congenitally short pedicles, and degenerative disc disease causing prominent impingement at L4-5, and moderate impingement at L2-3, L3-4, and L5-S1 as noted above. Electronically Signed   By: Van Clines M.D.   On: 08/19/2019 10:12     Assessment & Plan:   [name] was seen today for neck pain.  Diagnoses and all orders for this visit:  Chronic pain syndrome -     ToxASSURE Select 13 (MW), Urine  Cervical  radiculitis -     ToxASSURE Select 13 (MW), Urine  DDD (degenerative disc disease), cervical -     ToxASSURE Select 13 (MW), Urine  Long term current use of opiate analgesic -     ToxASSURE Select 13 (MW), Urine  Right hand pain -     ToxASSURE Select 13 (MW), Urine  Left arm numbness -     ToxASSURE Select 13 (MW), Urine  Chronic neck pain -     ToxASSURE Select 13 (MW), Urine  Other orders -     gabapentin (NEURONTIN) 100 MG capsule; Take 1 capsule (100 mg total) by mouth 2 (two) times daily. -     meloxicam (MOBIC) 15 MG tablet; Take 1 tablet (15 mg total) by mouth as needed. -     traMADol (ULTRAM) 50 MG tablet; Take 2 tablets (100 mg total) by mouth 3 (three) times daily.        ----------------------------------------------------------------------------------------------------------------------  Problem List Items Addressed This Visit       Unprioritized   Cervical radiculitis (Chronic)   Relevant Medications   gabapentin (NEURONTIN) 100 MG capsule   Other Relevant Orders   ToxASSURE Select 13 (MW), Urine   Chronic neck pain (Chronic)   Relevant Medications   gabapentin (NEURONTIN) 100 MG capsule   meloxicam (MOBIC) 15 MG  tablet   traMADol (ULTRAM) 50 MG tablet   Other Relevant Orders   ToxASSURE Select 13 (MW), Urine   Chronic pain syndrome - Primary (Chronic)   Relevant Medications   gabapentin (NEURONTIN) 100 MG capsule   meloxicam (MOBIC) 15 MG tablet   traMADol (ULTRAM) 50 MG tablet   Other Relevant Orders   ToxASSURE Select 13 (MW), Urine   DDD (degenerative disc disease), cervical (Chronic)   Relevant Medications   meloxicam (MOBIC) 15 MG tablet   traMADol (ULTRAM) 50 MG tablet   Other Relevant Orders   ToxASSURE Select 13 (MW), Urine   Left arm numbness   Relevant Orders   ToxASSURE Select 13 (MW), Urine   Long term current use of opiate analgesic   Relevant Orders   ToxASSURE Select 13 (MW), Urine   Right hand pain   Relevant Orders    ToxASSURE Select 13 (MW), Urine      ----------------------------------------------------------------------------------------------------------------------  1. Chronic pain syndrome Continue current medication management and I have reviewed the [facility] practitioner database and it is appropriate for refills dated for the next 3 months.  We will schedule him for 3 months return to clinic.  No other changes are needed at this time and we will keep him on his gabapentin and meloxicam. - ToxASSURE Select 13 (MW), Urine  2. Cervical radiculitis As above - ToxASSURE Select 13 (MW), Urine  3. DDD (degenerative disc disease), cervical As above - ToxASSURE Select 13 (MW), Urine  4. Long term current use of opiate analgesic  - ToxASSURE Select 13 (MW), Urine  5. Right hand pain  - ToxASSURE Select 13 (MW), Urine  6. Left arm numbness  - ToxASSURE Select 13 (MW), Urine  7. Chronic neck pain  - ToxASSURE Select 13 (MW), Urine    ----------------------------------------------------------------------------------------------------------------------  I have changed Carloyn Manner H. Hoglund's meloxicam. I am also having him start on traMADol. Additionally, I am having him maintain his pravastatin, omeprazole, fluticasone, PreviDent 5000 Sensitive, lisinopril, and gabapentin.   Meds ordered this encounter  Medications   gabapentin (NEURONTIN) 100 MG capsule    Sig: Take 1 capsule (100 mg total) by mouth 2 (two) times daily.    Dispense:  60 capsule    Refill:  2   meloxicam (MOBIC) 15 MG tablet    Sig: Take 1 tablet (15 mg total) by mouth as needed.    Dispense:  30 tablet    Refill:  5   traMADol (ULTRAM) 50 MG tablet    Sig: Take 2 tablets (100 mg total) by mouth 3 (three) times daily.    Dispense:  180 tablet    Refill:  2   Patient's Medications  New Prescriptions   TRAMADOL (ULTRAM) 50 MG TABLET    Take 2 tablets (100 mg total) by mouth 3 (three) times daily.  Previous  Medications   FLUTICASONE (FLONASE) 50 MCG/ACT NASAL SPRAY    Place into both nostrils daily. prn   LISINOPRIL (ZESTRIL) 10 MG TABLET    Take 10 mg by mouth daily.   OMEPRAZOLE (PRILOSEC) 40 MG CAPSULE    Take 40 mg by mouth 3 (three) times daily.   PRAVASTATIN (PRAVACHOL) 10 MG TABLET    Take 10 mg by mouth daily.   PREVIDENT 5000 SENSITIVE 1.1-5 % PSTE    Take 1 application by mouth daily.  Modified Medications   Modified Medication Previous Medication   GABAPENTIN (NEURONTIN) 100 MG CAPSULE gabapentin (NEURONTIN) 100 MG capsule  Take 1 capsule (100 mg total) by mouth 2 (two) times daily.    Take 1 capsule (100 mg total) by mouth 2 (two) times daily.   MELOXICAM (MOBIC) 15 MG TABLET meloxicam (MOBIC) 15 MG tablet      Take 1 tablet (15 mg total) by mouth as needed.    Take 15 mg by mouth as needed.   Discontinued Medications   No medications on file   ----------------------------------------------------------------------------------------------------------------------  Follow-up: No follow-ups on file.  Continue follow-up with his primary care physician for baseline medical care.  Yevette Edwards, MD

## 2022-03-21 NOTE — Progress Notes (Signed)
Nursing Pain Medication Assessment:  Safety precautions to be maintained throughout the outpatient stay will include: orient to surroundings, keep bed in low position, maintain call bell within reach at all times, provide assistance with transfer out of bed and ambulation.  Medication Inspection Compliance: Pill count conducted under aseptic conditions, in front of the patient. Neither the pills nor the bottle was removed from the patient's sight at any time. Once count was completed pills were immediately returned to the patient in their original bottle.  Medication: Tramadol (Ultram) Pill/Patch Count:  29 of 120 pills remain Pill/Patch Appearance: Markings consistent with prescribed medication Bottle Appearance: Standard pharmacy container. Clearly labeled. Filled Date: 05 / 11 / 2023 Last Medication intake:  Today Safety precautions to be maintained throughout the outpatient stay will include: orient to surroundings, keep bed in low position, maintain call bell within reach at all times, provide assistance with transfer out of bed and ambulation.

## 2022-03-26 LAB — TOXASSURE SELECT 13 (MW), URINE

## 2022-06-29 ENCOUNTER — Other Ambulatory Visit: Payer: Self-pay | Admitting: Anesthesiology

## 2022-07-02 ENCOUNTER — Encounter: Payer: Self-pay | Admitting: Anesthesiology

## 2022-07-02 ENCOUNTER — Ambulatory Visit: Payer: 59 | Attending: Anesthesiology | Admitting: Anesthesiology

## 2022-07-02 DIAGNOSIS — M542 Cervicalgia: Secondary | ICD-10-CM

## 2022-07-02 DIAGNOSIS — M5412 Radiculopathy, cervical region: Secondary | ICD-10-CM | POA: Diagnosis not present

## 2022-07-02 DIAGNOSIS — M79641 Pain in right hand: Secondary | ICD-10-CM

## 2022-07-02 DIAGNOSIS — M5030 Other cervical disc degeneration, unspecified cervical region: Secondary | ICD-10-CM

## 2022-07-02 DIAGNOSIS — G894 Chronic pain syndrome: Secondary | ICD-10-CM

## 2022-07-02 DIAGNOSIS — R200 Anesthesia of skin: Secondary | ICD-10-CM

## 2022-07-02 DIAGNOSIS — Z79891 Long term (current) use of opiate analgesic: Secondary | ICD-10-CM | POA: Diagnosis not present

## 2022-07-02 DIAGNOSIS — G8929 Other chronic pain: Secondary | ICD-10-CM

## 2022-07-02 NOTE — Progress Notes (Signed)
Virtual Visit via Telephone Note  I connected with Carlos Gentry on 07/02/22 at  3:30 PM EDT by telephone and verified that I am speaking with the correct person using two identifiers.  Location: Patient: Home Provider: Pain control center   I discussed the limitations, risks, security and privacy concerns of performing an evaluation and management service by telephone and the availability of in person appointments. I also discussed with the patient that there may be a patient responsible charge related to this service. The patient expressed understanding and agreed to proceed.   History of Present Illness: I spoke with Carlos Gentry via telephone as we are unable like for the video portion the conference.  He reports that he is doing well with his neck pain.  He staying active and doing his exercises.  The quality characteristic and distribution of the pain is stable in nature with no recent changes and generally well controlled with tramadol 2 tablets 3 times a day.  Has been taking this for an extended period of time and it works well for him.    Review of systems: General: No fevers or chills Pulmonary: No shortness of breath or dyspnea Cardiac: No angina or palpitations or lightheadedness GI: No abdominal pain or constipation Psych: No depression    Observations/Objective:  Current Outpatient Medications:    fluticasone (FLONASE) 50 MCG/ACT nasal spray, Place into both nostrils daily. prn, Disp: , Rfl:    gabapentin (NEURONTIN) 100 MG capsule, Take 1 capsule (100 mg total) by mouth 2 (two) times daily., Disp: 60 capsule, Rfl: 2   lisinopril (ZESTRIL) 10 MG tablet, Take 10 mg by mouth daily., Disp: , Rfl:    omeprazole (PRILOSEC) 40 MG capsule, Take 40 mg by mouth 3 (three) times daily., Disp: , Rfl:    pravastatin (PRAVACHOL) 10 MG tablet, Take 10 mg by mouth daily., Disp: , Rfl:    PREVIDENT 5000 SENSITIVE 1.1-5 % PSTE, Take 1 application by mouth daily., Disp: , Rfl: 0   traMADol  (ULTRAM) 50 MG tablet, TAKE 2 TABLETS (100 MG TOTAL) BY MOUTH 3 (THREE) TIMES DAILY., Disp: 180 tablet, Rfl: 2   Past Medical History:  Diagnosis Date   Fatty liver    11/18   GERD (gastroesophageal reflux disease)    Hypercholesteremia    Left arm numbness 07/11/2020   Right hand pain 03/27/2020   Tonsillitis    4/18     Assessment and Plan: 1. Chronic pain syndrome   2. Cervical radiculitis   3. DDD (degenerative disc disease), cervical   4. Long term current use of opiate analgesic   5. Right hand pain   6. Left arm numbness   7. Chronic neck pain   I am pleased that Carlos Gentry is doing well with his chronic neck pain.  The quality is stable and he is doing well with the medicines which are enabling him to stay active and functional and sleep better at night.  I want him to continue doing his stretching strengthening exercises as reviewed and we will schedule him for a 60-month return to clinic.  I have reviewed the [facility] practitioner database information and its appropriate for refill.  Continue follow-up with his primary care physician for baseline medical care.  Follow Up Instructions:    I discussed the assessment and treatment plan with the patient. The patient was provided an opportunity to ask questions and all were answered. The patient agreed with the plan and demonstrated an understanding of the  instructions.   The patient was advised to call back or seek an in-person evaluation if the symptoms worsen or if the condition fails to improve as anticipated.  I provided 30 minutes of non-face-to-face time during this encounter.   Yevette Edwards, MD

## 2022-10-14 ENCOUNTER — Other Ambulatory Visit: Payer: Self-pay | Admitting: Anesthesiology

## 2022-10-16 ENCOUNTER — Encounter: Payer: Self-pay | Admitting: Anesthesiology

## 2022-10-16 ENCOUNTER — Ambulatory Visit: Payer: 59 | Attending: Anesthesiology | Admitting: Anesthesiology

## 2022-10-16 DIAGNOSIS — M5412 Radiculopathy, cervical region: Secondary | ICD-10-CM

## 2022-10-16 DIAGNOSIS — M5030 Other cervical disc degeneration, unspecified cervical region: Secondary | ICD-10-CM | POA: Diagnosis not present

## 2022-10-16 DIAGNOSIS — G8929 Other chronic pain: Secondary | ICD-10-CM

## 2022-10-16 DIAGNOSIS — M542 Cervicalgia: Secondary | ICD-10-CM

## 2022-10-16 DIAGNOSIS — G894 Chronic pain syndrome: Secondary | ICD-10-CM | POA: Diagnosis not present

## 2022-10-16 DIAGNOSIS — R200 Anesthesia of skin: Secondary | ICD-10-CM

## 2022-10-16 DIAGNOSIS — Z79891 Long term (current) use of opiate analgesic: Secondary | ICD-10-CM

## 2022-10-16 DIAGNOSIS — M79641 Pain in right hand: Secondary | ICD-10-CM

## 2022-10-16 MED ORDER — TRAMADOL HCL 50 MG PO TABS: 100.0000 mg | ORAL_TABLET | Freq: Three times a day (TID) | ORAL | 2 refills | Status: AC

## 2022-10-16 NOTE — Progress Notes (Signed)
Virtual Visit via Telephone Note  I connected with Carlos Gentry on 10/16/22 at  2:00 PM EST by telephone and verified that I am speaking with the correct person using two identifiers.  Location: Patient: Home Provider: Pain control center   I discussed the limitations, risks, security and privacy concerns of performing an evaluation and management service by telephone and the availability of in person appointments. I also discussed with the patient that there may be a patient responsible charge related to this service. The patient expressed understanding and agreed to proceed.   History of Present Illness: I spoke with Carlos Gentry via telephone as we are unable link for the video portion conference.  He reports that he has been doing reasonably well with his neck pain and it is controlled.  He still gets some radicular symptoms especially with work that he does overhead and he continues to work full-time.  He tries to avoid certain activities but continues to do his stretching strengthening activities which enable him to stay functional.  He still takes his tramadol 2 tablets 3 times a day and this is working well and without side effects.  Otherwise he is in his usual state of health with no changes in upper extremity or lower extremity symptoms.  Bowel bladder functions been good and no side effects with the medication reported he generally gets about 50 to 70% relief with the medicines when he is taking it and tries to skip occasional dosing if tolerated.  Review of systems: General: No fevers or chills Pulmonary: No shortness of breath or dyspnea Cardiac: No angina or palpitations or lightheadedness GI: No abdominal pain or constipation Psych: No depression    Observations/Objective:  Current Outpatient Medications:    traMADol (ULTRAM) 50 MG tablet, Take 2 tablets (100 mg total) by mouth 3 (three) times daily., Disp: 180 tablet, Rfl: 2   fluticasone (FLONASE) 50 MCG/ACT nasal spray, Place  into both nostrils daily. prn, Disp: , Rfl:    gabapentin (NEURONTIN) 100 MG capsule, Take 1 capsule (100 mg total) by mouth 2 (two) times daily., Disp: 60 capsule, Rfl: 2   lisinopril (ZESTRIL) 10 MG tablet, Take 10 mg by mouth daily., Disp: , Rfl:    omeprazole (PRILOSEC) 40 MG capsule, Take 40 mg by mouth 3 (three) times daily., Disp: , Rfl:    pravastatin (PRAVACHOL) 10 MG tablet, Take 10 mg by mouth daily., Disp: , Rfl:    PREVIDENT 5000 SENSITIVE 1.1-5 % PSTE, Take 1 application by mouth daily., Disp: , Rfl: 0   Past Medical History:  Diagnosis Date   Fatty liver    11/18   GERD (gastroesophageal reflux disease)    Hypercholesteremia    Left arm numbness 07/11/2020   Right hand pain 03/27/2020   Tonsillitis    4/18     Assessment and Plan: 1. Chronic pain syndrome   2. Cervical radiculitis   3. DDD (degenerative disc disease), cervical   4. Long term current use of opiate analgesic   5. Right hand pain   6. Left arm numbness   7. Chronic neck pain   Based on our conversation it is appropriate to refill his medicines for the next 3 months.  This to be dated for today.  Continue 2 tablets 3 times a day as needed and continue anti-inflammatories if able with conservative use.  Continue stretching strengthening exercises as reviewed once again today.  Will have him return to clinic in 3 months with continue follow-up  with his primary care physicians for baseline medical care.  Follow Up Instructions:    I discussed the assessment and treatment plan with the patient. The patient was provided an opportunity to ask questions and all were answered. The patient agreed with the plan and demonstrated an understanding of the instructions.   The patient was advised to call back or seek an in-person evaluation if the symptoms worsen or if the condition fails to improve as anticipated.  I provided 30 minutes of non-face-to-face time during this encounter.   Molli Barrows, MD

## 2022-11-07 ENCOUNTER — Telehealth: Payer: 59 | Admitting: Anesthesiology

## 2022-12-19 ENCOUNTER — Other Ambulatory Visit: Payer: Self-pay | Admitting: Family Medicine

## 2022-12-19 DIAGNOSIS — Z0000 Encounter for general adult medical examination without abnormal findings: Secondary | ICD-10-CM

## 2022-12-19 DIAGNOSIS — Z9189 Other specified personal risk factors, not elsewhere classified: Secondary | ICD-10-CM

## 2023-01-02 ENCOUNTER — Ambulatory Visit
Admission: RE | Admit: 2023-01-02 | Discharge: 2023-01-02 | Disposition: A | Discharge status: Home / Self Care | Payer: 59 | Source: Ambulatory Visit | Attending: Family Medicine | Admitting: Family Medicine

## 2023-01-02 DIAGNOSIS — Z9189 Other specified personal risk factors, not elsewhere classified: Secondary | ICD-10-CM | POA: Insufficient documentation

## 2023-01-02 DIAGNOSIS — R911 Solitary pulmonary nodule: Secondary | ICD-10-CM

## 2023-01-02 DIAGNOSIS — Z0000 Encounter for general adult medical examination without abnormal findings: Secondary | ICD-10-CM | POA: Insufficient documentation

## 2023-01-02 HISTORY — DX: Solitary pulmonary nodule: R91.1

## 2023-01-30 ENCOUNTER — Encounter: Payer: Self-pay | Admitting: Anesthesiology

## 2023-01-30 ENCOUNTER — Ambulatory Visit: Payer: 59 | Attending: Anesthesiology | Admitting: Anesthesiology

## 2023-01-30 DIAGNOSIS — G894 Chronic pain syndrome: Secondary | ICD-10-CM | POA: Diagnosis not present

## 2023-01-30 DIAGNOSIS — M79641 Pain in right hand: Secondary | ICD-10-CM | POA: Diagnosis not present

## 2023-01-30 DIAGNOSIS — R200 Anesthesia of skin: Secondary | ICD-10-CM

## 2023-01-30 DIAGNOSIS — M5412 Radiculopathy, cervical region: Secondary | ICD-10-CM

## 2023-01-30 DIAGNOSIS — G8929 Other chronic pain: Secondary | ICD-10-CM

## 2023-01-30 DIAGNOSIS — M5030 Other cervical disc degeneration, unspecified cervical region: Secondary | ICD-10-CM | POA: Diagnosis not present

## 2023-01-30 DIAGNOSIS — Z79891 Long term (current) use of opiate analgesic: Secondary | ICD-10-CM

## 2023-01-30 MED ORDER — TRAMADOL HCL 50 MG PO TABS: 100.0000 mg | ORAL_TABLET | Freq: Three times a day (TID) | ORAL | 2 refills | Status: AC

## 2023-01-30 NOTE — Progress Notes (Signed)
Virtual Visit via Telephone Note  I connected with Carlos Gentry on 01/30/23 at 11:40 AM EDT by telephone and verified that I am speaking with the correct person using two identifiers.  Location: Patient: Home Provider: Pain control center   I discussed the limitations, risks, security and privacy concerns of performing an evaluation and management service by telephone and the availability of in person appointments. I also discussed with the patient that there may be a patient responsible charge related to this service. The patient expressed understanding and agreed to proceed.   History of Present Illness: Is able to catch up with voicemail via telephone as we were unable link for the video portion conference.  He reports that his neck pain has been stable.  He is no longer experiencing much of the left or right arm numbness and tingling sensations that he has transiently had in the past.  He still gets some chronic neck pain of an aching gnawing pain worse with the activity.  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  He takes tramadol 2 tablets 3 times a day and this gives him about 75% relief when he takes them.  No side effects are reported.  Unfortunately he has failed more conservative therapy but the tramadol keeps him going to keep him active and enables him to fulfill his responsibilities and sleep better at night.  No change in strength or function to the upper or lower extremities is noted.  Review of systems: General: No fevers or chills Pulmonary: No shortness of breath or dyspnea Cardiac: No angina or palpitations or lightheadedness GI: No abdominal pain or constipation Psych: No depression    Observations/Objective:   Current Outpatient Medications:    traMADol (ULTRAM) 50 MG tablet, Take 2 tablets (100 mg total) by mouth 3 (three) times daily., Disp: 180 tablet, Rfl: 2   fluticasone (FLONASE) 50 MCG/ACT nasal spray, Place into both nostrils daily. prn,  Disp: , Rfl:    gabapentin (NEURONTIN) 100 MG capsule, Take 1 capsule (100 mg total) by mouth 2 (two) times daily., Disp: 60 capsule, Rfl: 2   lisinopril (ZESTRIL) 10 MG tablet, Take 10 mg by mouth daily., Disp: , Rfl:    omeprazole (PRILOSEC) 40 MG capsule, Take 40 mg by mouth 3 (three) times daily., Disp: , Rfl:    pravastatin (PRAVACHOL) 10 MG tablet, Take 10 mg by mouth daily., Disp: , Rfl:    PREVIDENT 5000 SENSITIVE 1.1-5 % PSTE, Take 1 application by mouth daily., Disp: , Rfl: 0   Past Medical History:  Diagnosis Date   Fatty liver    11/18   GERD (gastroesophageal reflux disease)    Hypercholesteremia    Left arm numbness 07/11/2020   Right hand pain 03/27/2020   Tonsillitis    4/18    Assessment and Plan:  1. Chronic pain syndrome   2. Cervical radiculitis   3. DDD (degenerative disc disease), cervical   4. Long term current use of opiate analgesic   5. Right hand pain   6. Left arm numbness   7. Chronic neck pain   Based on our discussion I gets appropriate to refill his medicines for the next 3 months.  Will keep him on the tramadol 2 tablets 3 times a day.  I have reviewed the [facility] practitioner database information and it is appropriate for refill.  Continue with conservative care and anti-inflammatory medicines as needed.  Continue core stretching strengthening exercises as reviewed.  Continue follow-up with his primary care physician for baseline medical care with scheduled return to clinic in 3 months. Follow Up Instructions:    I discussed the assessment and treatment plan with the patient. The patient was provided an opportunity to ask questions and all were answered. The patient agreed with the plan and demonstrated an understanding of the instructions.   The patient was advised to call back or seek an in-person evaluation if the symptoms worsen or if the condition fails to improve as anticipated.  I provided 30 minutes of non-face-to-face time during  this encounter.   Yevette Edwards, MD

## 2023-05-12 ENCOUNTER — Ambulatory Visit: Payer: 59 | Attending: Anesthesiology | Admitting: Anesthesiology

## 2023-05-12 ENCOUNTER — Encounter: Payer: Self-pay | Admitting: Anesthesiology

## 2023-05-12 VITALS — BP 119/80 | HR 75 | Temp 97.2°F | Resp 16 | Ht 68.0 in | Wt 230.0 lb

## 2023-05-12 DIAGNOSIS — G894 Chronic pain syndrome: Secondary | ICD-10-CM

## 2023-05-12 DIAGNOSIS — M5412 Radiculopathy, cervical region: Secondary | ICD-10-CM

## 2023-05-12 DIAGNOSIS — M5030 Other cervical disc degeneration, unspecified cervical region: Secondary | ICD-10-CM | POA: Diagnosis not present

## 2023-05-12 DIAGNOSIS — Z79891 Long term (current) use of opiate analgesic: Secondary | ICD-10-CM | POA: Diagnosis present

## 2023-05-12 DIAGNOSIS — M542 Cervicalgia: Secondary | ICD-10-CM | POA: Insufficient documentation

## 2023-05-12 DIAGNOSIS — M79641 Pain in right hand: Secondary | ICD-10-CM | POA: Diagnosis not present

## 2023-05-12 DIAGNOSIS — G8929 Other chronic pain: Secondary | ICD-10-CM | POA: Insufficient documentation

## 2023-05-12 MED ORDER — TRAMADOL HCL 50 MG PO TABS: 100.0000 mg | ORAL_TABLET | Freq: Three times a day (TID) | ORAL | 2 refills | Status: AC | PRN

## 2023-05-12 NOTE — Progress Notes (Unsigned)
Nursing Pain Medication Assessment:  Safety precautions to be maintained throughout the outpatient stay will include: orient to surroundings, keep bed in low position, maintain call bell within reach at all times, provide assistance with transfer out of bed and ambulation.  Medication Inspection Compliance: Pill count conducted under aseptic conditions, in front of the patient. Neither the pills nor the bottle was removed from the patient's sight at any time. Once count was completed pills were immediately returned to the patient in their original bottle.  Medication: Tramadol (Ultram) Pill/Patch Count:  13 of 180 pills remain Pill/Patch Appearance: Markings consistent with prescribed medication Bottle Appearance: Standard pharmacy container. Clearly labeled. Filled Date: 07 / 15 / 2024 Last Medication intake:  Today

## 2023-05-13 ENCOUNTER — Encounter: Payer: Self-pay | Admitting: Anesthesiology

## 2023-05-13 NOTE — Progress Notes (Signed)
Subjective:  Patient ID: Carlos Gentry, male    DOB: Jun 02, 1966  Age: 57 y.o. MRN: [id number]  CC: Hand Pain (right)   Procedure: None  HPI BATES DENNINGTON presents for reevaluation.  Right continues to have a fair amount of cervical pain comparable to what he is experienced in the past.  Its mainly worse with any type of overhead work.  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  Its mainly in the upper cervical region with occasional tension type headaches and occasional dizziness.  He takes his tramadol 2 tablets 3 times a day and this effectively reduces the severity of pain and allows him to stay functional active and complete his work schedule.  No side effects of the medication are reported.  No change in strength of the upper extremities is noted.  No sensory deficits.  Otherwise he is in his usual state of health.  Outpatient Medications Prior to Visit  Medication Sig Dispense Refill   aspirin EC 81 MG tablet Take by mouth.     fluticasone (FLONASE) 50 MCG/ACT nasal spray Place into both nostrils daily. prn     gabapentin (NEURONTIN) 100 MG capsule Take 1 capsule (100 mg total) by mouth 2 (two) times daily. 60 capsule 2   ibuprofen (ADVIL) 200 MG tablet Take by mouth.     lisinopril (ZESTRIL) 10 MG tablet Take 10 mg by mouth daily.     omeprazole (PRILOSEC) 40 MG capsule Take 40 mg by mouth 3 (three) times daily.     PREVIDENT 5000 SENSITIVE 1.1-5 % PSTE Take 1 application by mouth daily.  0   rosuvastatin (CRESTOR) 20 MG tablet Take by mouth.     traMADol (ULTRAM) 50 MG tablet Take 100 mg by mouth 3 (three) times daily as needed.     pravastatin (PRAVACHOL) 10 MG tablet Take 10 mg by mouth daily. (Patient not taking: Reported on 05/12/2023)     No facility-administered medications prior to visit.    Review of Systems CNS: No confusion or sedation Cardiac: No angina or palpitations GI: No  abdominal pain or constipation Constitutional: No nausea vomiting fevers or chills  Objective:  BP 119/80   Pulse 75   Temp (!) 97.2 F (36.2 C)   Resp 16   Ht 5\' 8"  (1.727 m)   Wt 230 lb (104.3 kg)   SpO2 100%   BMI 34.97 kg/m    BP Readings from Last 3 Encounters:  05/12/23 119/80  03/21/22 130/89  08/01/19 (!) 145/80     Wt Readings from Last 3 Encounters:  05/12/23 230 lb (104.3 kg)  03/21/22 230 lb (104.3 kg)  08/01/19 230 lb (104.3 kg)     Physical Exam Pt is alert and oriented PERRL EOMI HEART IS RRR no murmur or rub LCTA no wheezing or rales MUSCULOSKELETAL reveals good hand grasp strength at 5/5.  Proximal strength at the bicep tricep is 5/5 as is strength in the wrist.  Good motion about the glenohumeral joint and good muscle tone and bulk throughout the upper extremities.  No sensory deficits on examination.  Labs  No results found for: "HGBA1C" Lab Results  Component Value Date   CREATININE 0.98 06/18/2019    -------------------------------------------------------------------------------------------------------------------- Lab Results  Component Value Date   WBC 3.3 (L) 06/18/2019   HGB 14.1 06/18/2019   HCT 42.8 06/18/2019   PLT 234 06/18/2019  GLUCOSE 119 (H) 06/18/2019   NA 140 06/18/2019   K 4.5 06/18/2019   CL 108 06/18/2019   CREATININE 0.98 06/18/2019   BUN 14 06/18/2019   CO2 24 06/18/2019    --------------------------------------------------------------------------------------------------------------------- CT CARDIAC SCORING (SELF PAY ONLY)  Addendum Date: 01/05/2023   ADDENDUM REPORT: 01/05/2023 12:01 EXAM: OVER-READ INTERPRETATION  CT CHEST The following report is an over-read performed by radiologist Dr. Curly Shores [facility] on 01/05/2023. This over-read does not include interpretation of cardiac or coronary anatomy or pathology. The coronary calcium score interpretation by the cardiologist is attached.  COMPARISON:  None. FINDINGS: Cardiovascular:  Findings discussed in the body of the report. Mediastinum/Nodes: No suspicious adenopathy identified. Imaged mediastinal structures are unremarkable. Lungs/Pleura: 4 mm nodule left lower lobe. Unless the patient is deemed to be at high risk no further evaluation is necessary. Imaged lungs are otherwise clear. No pleural effusion or pneumothorax. Upper Abdomen: No acute abnormality. Musculoskeletal: Mild bilateral gynecomastia. No acute or significant osseous findings. There are thoracic degenerative changes. IMPRESSION: 1. Left lower lobe 4 mm lung nodule. If the patient is high risk consider further evaluation with a chest CT to assess for additional lesions. Otherwise no follow up is recommended. 2. Mild bilateral gynecomastia. Electronically Signed   By: Layla Maw M.D.   On: 01/05/2023 12:01   Result Date: 01/05/2023 CLINICAL DATA:  Risk stratification EXAM: Coronary Calcium Score TECHNIQUE: The patient was scanned on a Siemens Somatom scanner. Axial non-contrast 3 mm slices were carried out through the heart. The data set was analyzed on a dedicated work station and scored using the Agatson method. FINDINGS: Non-cardiac: See separate report from [facility]. Ascending Aorta: Normal size Pericardium: Normal Coronary arteries: Normal origin of left and right coronary arteries. Distribution of arterial calcifications if present, as noted below; LM 0 LAD 58.9 LCx 0 RCA 0 Total 58.9 IMPRESSION AND RECOMMENDATION: 1. Coronary calcium score of 58.9. This was 75th percentile for age and sex matched control. 2. CAC 1-99 in LAD. CAC-DRS A1/N1. 3. Continue heart healthy lifestyle and risk factor modification. Electronically Signed: By: Debbe Odea M.D. On: 01/02/2023 17:19     Assessment & Plan:   Agnew was seen today for hand pain.  Diagnoses and all orders for this visit:  Chronic pain syndrome -     ToxASSURE Select 13 (MW), Urine  Cervical  radiculitis  DDD (degenerative disc disease), cervical  Right hand pain  Long term current use of opiate analgesic  Chronic neck pain -     ToxASSURE Select 13 (MW), Urine  Other orders -     traMADol (ULTRAM) 50 MG tablet; Take 2 tablets (100 mg total) by mouth 3 (three) times daily as needed.        ----------------------------------------------------------------------------------------------------------------------  Problem List Items Addressed This Visit       Unprioritized   Cervical radiculitis (Chronic)   Chronic neck pain (Chronic)   Relevant Medications   ibuprofen (ADVIL) 200 MG tablet   aspirin EC 81 MG tablet   traMADol (ULTRAM) 50 MG tablet   Other Relevant Orders   ToxASSURE Select 13 (MW), Urine   Chronic pain syndrome - Primary (Chronic)   Relevant Medications   ibuprofen (ADVIL) 200 MG tablet   aspirin EC 81 MG tablet   traMADol (ULTRAM) 50 MG tablet   Other Relevant Orders   ToxASSURE Select 13 (MW), Urine   DDD (degenerative disc disease), cervical (Chronic)   Relevant Medications   ibuprofen (ADVIL) 200  MG tablet   aspirin EC 81 MG tablet   traMADol (ULTRAM) 50 MG tablet   Long term current use of opiate analgesic   Right hand pain      ----------------------------------------------------------------------------------------------------------------------  1. Chronic pain syndrome Kamaron continues to respond favorably to tramadol.  I think it is reasonable to continue this.  I have reviewed the [facility] practitioner database information as appropriate.  Refill will be for 2 tablets 3 times a day.  No other changes in his regimen will be initiated.  Have encouraged him to continue with stretching strengthening exercises as reviewed today. - ToxASSURE Select 13 (MW), Urine  2. Cervical radiculitis As above.  Avoid overhead lifting as much as possible.  I do not feel any other radiographic studies are indicated at this time.  He seems to be  stable in nature.  3. DDD (degenerative disc disease), cervical As above  4. Right hand pain Doing well with no sensory or motor deficit.  5. Long term current use of opiate analgesic   6. Chronic neck pain  - ToxASSURE Select 13 (MW), Urine    ----------------------------------------------------------------------------------------------------------------------  I have changed Daryll Brod. Bolander's traMADol. I am also having him maintain his pravastatin, omeprazole, fluticasone, PreviDent 5000 Sensitive, lisinopril, gabapentin, rosuvastatin, ibuprofen, and aspirin EC.   Meds ordered this encounter  Medications   traMADol (ULTRAM) 50 MG tablet    Sig: Take 2 tablets (100 mg total) by mouth 3 (three) times daily as needed.    Dispense:  180 tablet    Refill:  2   Patient's Medications  New Prescriptions   No medications on file  Previous Medications   ASPIRIN EC 81 MG TABLET    Take by mouth.   FLUTICASONE (FLONASE) 50 MCG/ACT NASAL SPRAY    Place into both nostrils daily. prn   GABAPENTIN (NEURONTIN) 100 MG CAPSULE    Take 1 capsule (100 mg total) by mouth 2 (two) times daily.   IBUPROFEN (ADVIL) 200 MG TABLET    Take by mouth.   LISINOPRIL (ZESTRIL) 10 MG TABLET    Take 10 mg by mouth daily.   OMEPRAZOLE (PRILOSEC) 40 MG CAPSULE    Take 40 mg by mouth 3 (three) times daily.   PRAVASTATIN (PRAVACHOL) 10 MG TABLET    Take 10 mg by mouth daily.   PREVIDENT 5000 SENSITIVE 1.1-5 % PSTE    Take 1 application by mouth daily.   ROSUVASTATIN (CRESTOR) 20 MG TABLET    Take by mouth.  Modified Medications   Modified Medication Previous Medication   TRAMADOL (ULTRAM) 50 MG TABLET traMADol (ULTRAM) 50 MG tablet      Take 2 tablets (100 mg total) by mouth 3 (three) times daily as needed.    Take 100 mg by mouth 3 (three) times daily as needed.  Discontinued Medications   No medications on file    ----------------------------------------------------------------------------------------------------------------------  Follow-up: No follow-ups on file.  Scheduled for 3 months.  Continue follow-up with the primary care physicians for baseline medical care.  Yevette Edwards, MD

## 2023-05-19 ENCOUNTER — Encounter: Payer: 59 | Admitting: Anesthesiology

## 2023-05-27 ENCOUNTER — Other Ambulatory Visit: Payer: Self-pay | Admitting: Surgery

## 2023-05-28 ENCOUNTER — Inpatient Hospital Stay: Admission: RE | Admit: 2023-05-28 | Payer: 59 | Source: Ambulatory Visit

## 2023-07-08 ENCOUNTER — Encounter: Payer: Self-pay | Admitting: Anesthesiology

## 2023-07-08 ENCOUNTER — Ambulatory Visit: Payer: 59 | Attending: Anesthesiology | Admitting: Anesthesiology

## 2023-07-08 DIAGNOSIS — Z79891 Long term (current) use of opiate analgesic: Secondary | ICD-10-CM

## 2023-07-08 DIAGNOSIS — M79641 Pain in right hand: Secondary | ICD-10-CM

## 2023-07-08 DIAGNOSIS — G894 Chronic pain syndrome: Secondary | ICD-10-CM

## 2023-07-08 DIAGNOSIS — M5412 Radiculopathy, cervical region: Secondary | ICD-10-CM | POA: Diagnosis not present

## 2023-07-08 DIAGNOSIS — G8929 Other chronic pain: Secondary | ICD-10-CM

## 2023-07-08 DIAGNOSIS — R200 Anesthesia of skin: Secondary | ICD-10-CM

## 2023-07-08 DIAGNOSIS — M5030 Other cervical disc degeneration, unspecified cervical region: Secondary | ICD-10-CM

## 2023-07-08 MED ORDER — GABAPENTIN 100 MG PO CAPS: 100.0000 mg | ORAL_CAPSULE | Freq: Two times a day (BID) | ORAL | 2 refills | Status: DC

## 2023-07-08 MED ORDER — TRAMADOL HCL 50 MG PO TABS: 100.0000 mg | ORAL_TABLET | Freq: Three times a day (TID) | ORAL | 2 refills | Status: AC

## 2023-07-08 NOTE — Progress Notes (Signed)
Virtual Visit via Telephone Note  I connected with Carlos Gentry on 07/08/23 at  2:40 PM EDT by telephone and verified that I am speaking with the correct person using two identifiers.  Location: Patient: Home Provider: Pain control center   I discussed the limitations, risks, security and privacy concerns of performing an evaluation and management service by telephone and the availability of in person appointments. I also discussed with the patient that there may be a patient responsible charge related to this service. The patient expressed understanding and agreed to proceed.   History of Present Illness: I spoke with Keturah Barre via telephone today.  We could not do the video portion of the virtual conference.  He reports that he still having a significant amount of carpal tunnel related symptoms that wake him up especially at night.  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  These projects keeping very busy working with his hands and with the associated brace at night he has a lot of spasming and pain.  He is due for a carpal tunnel release.  He is also still having neck pain comparable to what he has had in the past no significant change in quality characteristic or distribution is noted.  He takes tramadol 2 tablets 3 times a day and this continues to work well for him.  No side effects of the medication are noted.  Otherwise he is in his usual state of health.  He mentions that he occasionally has some dizziness associated with muscle spasm in the neck when he has been particularly active but once he backs off from the activity this abates.  No change in upper extremity strength function or bowel or bladder function is noted.  Otherwise he reports that he is doing well  Review of systems: General: No fevers or chills Pulmonary: No shortness of breath or dyspnea Cardiac: No angina or palpitations or lightheadedness GI: No abdominal pain or  constipation Psych: No depression    Observations/Objective:   Current Outpatient Medications:    [START ON 07/10/2023] traMADol (ULTRAM) 50 MG tablet, Take 2 tablets (100 mg total) by mouth 3 (three) times daily., Disp: 180 tablet, Rfl: 2   aspirin EC 81 MG tablet, Take by mouth., Disp: , Rfl:    fluticasone (FLONASE) 50 MCG/ACT nasal spray, Place into both nostrils daily. prn, Disp: , Rfl:    gabapentin (NEURONTIN) 100 MG capsule, Take 1 capsule (100 mg total) by mouth 2 (two) times daily., Disp: 60 capsule, Rfl: 2   ibuprofen (ADVIL) 200 MG tablet, Take by mouth., Disp: , Rfl:    lisinopril (ZESTRIL) 10 MG tablet, Take 10 mg by mouth daily., Disp: , Rfl:    omeprazole (PRILOSEC) 40 MG capsule, Take 40 mg by mouth 3 (three) times daily., Disp: , Rfl:    pravastatin (PRAVACHOL) 10 MG tablet, Take 10 mg by mouth daily. (Patient not taking: Reported on 05/12/2023), Disp: , Rfl:    PREVIDENT 5000 SENSITIVE 1.1-5 % PSTE, Take 1 application by mouth daily., Disp: , Rfl: 0   rosuvastatin (CRESTOR) 20 MG tablet, Take by mouth., Disp: , Rfl:    Past Medical History:  Diagnosis Date   Fatty liver    11/18   GERD (gastroesophageal reflux disease)    Hypercholesteremia    Left arm numbness 07/11/2020   Right hand pain 03/27/2020   Tonsillitis    4/18    Assessment and Plan:  1. Chronic pain syndrome  2. Cervical radiculitis   3. DDD (degenerative disc disease), cervical   4. Right hand pain   5. Long term current use of opiate analgesic   6. Chronic neck pain   7. Left arm numbness   Based on our conversation it is appropriate to refill his medicines for the next 3 months.  He continues to have successful relief of a significant portion of his neck pain and hand pain with the tramadol 2 tablets 3 times a day.  He derives a good functional improvement and stays active working a very busy schedule.  I have encouraged him to follow-up and get the carpal tunnel release done when possible.  No  other changes in his pharmacologic regimen will be initiated.  Continue stretching exercises for his neck and avoid heavy lifting.  Continue follow-up with his primary care physicians for baseline medical care with schedule return to clinic in 3 months Follow Up Instructions:    I discussed the assessment and treatment plan with the patient. The patient was provided an opportunity to ask questions and all were answered. The patient agreed with the plan and demonstrated an understanding of the instructions.   The patient was advised to call back or seek an in-person evaluation if the symptoms worsen or if the condition fails to improve as anticipated.  I provided 30 minutes of non-face-to-face time during this encounter.   Yevette Edwards, MD

## 2023-07-31 ENCOUNTER — Inpatient Hospital Stay
Admission: RE | Admit: 2023-07-31 | Discharge: 2023-07-31 | Disposition: A | Discharge status: Home / Self Care | Payer: 59 | Source: Ambulatory Visit

## 2023-07-31 ENCOUNTER — Other Ambulatory Visit: Payer: Self-pay | Admitting: Surgery

## 2023-07-31 HISTORY — DX: Essential (primary) hypertension: I10

## 2023-07-31 HISTORY — DX: Atherosclerotic heart disease of native coronary artery without angina pectoris: I25.10

## 2023-07-31 HISTORY — DX: Other cervical disc degeneration, unspecified cervical region: M50.30

## 2023-07-31 NOTE — Patient Instructions (Addendum)
Your procedure is scheduled on: 08/06/23 - Wednesday Report to the Registration Desk on the 1st floor of the [facility]. To find out your arrival time, please call [phone] between 1PM - 3PM on: 08/05/23/- Tuesday If your arrival time is 6:00 am, do not arrive before that time as the [facility] entrance doors do not open until 6:00 am.  REMEMBER: Instructions that are not followed completely may result in serious medical risk, up to and including death; or upon the discretion of your surgeon and anesthesiologist your surgery may need to be rescheduled.  Do not eat food after midnight the night before surgery.  No gum chewing or hard candies.  You may however, drink CLEAR liquids up to 2 hours before you are scheduled to arrive for your surgery. Do not drink anything within 2 hours of your scheduled arrival time.  Clear liquids include: - water  - apple juice without pulp - gatorade (not RED colors) - black coffee or tea (Do NOT add milk or creamers to the coffee or tea) Do NOT drink anything that is not on this list.  In addition, your doctor has ordered for you to drink the provided:  Ensure Pre-Surgery Clear Carbohydrate Drink  Drinking this carbohydrate drink up to two hours before surgery helps to reduce insulin resistance and improve patient outcomes. Please complete drinking 2 hours before scheduled arrival time.  One week prior to surgery: Stop Anti-inflammatories (NSAIDS) such as Advil, Aleve, Ibuprofen, Motrin, Naproxen, Naprosyn and Aspirin based products such as Excedrin, Goody's Powder, BC Powder. You may however, continue to take Tylenol if needed for pain up until the day of surgery.  Stop ANY OVER THE COUNTER supplements until after surgery.  HOLD lisinopril (ZESTRIL) on the day of surgery.  ON THE DAY OF SURGERY ONLY TAKE THESE MEDICATIONS WITH SIPS OF WATER:  gabapentin (NEURONTIN)  omeprazole (PRILOSEC)  traMADol (ULTRAM)     No Alcohol for 24 hours  before or after surgery.  No Smoking including e-cigarettes for 24 hours before surgery.  No chewable tobacco products for at least 6 hours before surgery.  No nicotine patches on the day of surgery.  Do not use any "recreational" drugs for at least a week (preferably 2 weeks) before your surgery.  Please be advised that the combination of cocaine and anesthesia may have negative outcomes, up to and including death. If you test positive for cocaine, your surgery will be cancelled.  On the morning of surgery brush your teeth with toothpaste and water, you may rinse your mouth with mouthwash if you wish. Do not swallow any toothpaste or mouthwash.  Use CHG Soap or wipes as directed on instruction sheet.  Do not wear jewelry, make-up, hairpins, clips or nail polish.  For welded (permanent) jewelry: bracelets, anklets, waist bands, etc.  Please have this removed prior to surgery.  If it is not removed, there is a chance that hospital personnel will need to cut it off on the day of surgery.  Do not wear lotions, powders, or perfumes.   Do not shave body hair from the neck down 48 hours before surgery.  Contact lenses, hearing aids and dentures may not be worn into surgery.  Do not bring valuables to the hospital. [facility] is not responsible for any missing/lost belongings or valuables.   Notify your doctor if there is any change in your medical condition (cold, fever, infection).  Wear comfortable clothing (specific to your surgery type) to the hospital.  After  surgery, you can help prevent lung complications by doing breathing exercises.  Take deep breaths and cough every 1-2 hours. Your doctor may order a device called an Incentive Spirometer to help you take deep breaths. When coughing or sneezing, hold a pillow firmly against your incision with both hands. This is called "splinting." Doing this helps protect your incision. It also decreases belly discomfort.  If you are being  admitted to the hospital overnight, leave your suitcase in the car. After surgery it may be brought to your room.  In case of increased patient census, it may be necessary for you, the patient, to continue your postoperative care in the Same Day Surgery department.  If you are being discharged the day of surgery, you will not be allowed to drive home. You will need a responsible individual to drive you home and stay with you for 24 hours after surgery.   If you are taking public transportation, you will need to have a responsible individual with you.  Please call the Pre-admissions Testing Dept. at [phone] if you have any questions about these instructions.  Surgery Visitation Policy:  Patients having surgery or a procedure may have two visitors.  Children under the age of 19 must have an adult with them who is not the patient.  Inpatient Visitation:    Visiting hours are 7 a.m. to 8 p.m. Up to four visitors are allowed at one time in a patient room. The visitors may rotate out with other people during the day.  One visitor age 76 or older may stay with the patient overnight and must be in the room by 8 p.m.    Preparing for Surgery with CHLORHEXIDINE GLUCONATE (CHG) Soap  Chlorhexidine Gluconate (CHG) Soap  o An antiseptic cleaner that kills germs and bonds with the skin to continue killing germs even after washing  o Used for showering the night before surgery and morning of surgery  Before surgery, you can play an important role by reducing the number of germs on your skin.  CHG (Chlorhexidine gluconate) soap is an antiseptic cleanser which kills germs and bonds with the skin to continue killing germs even after washing.  Please do not use if you have an allergy to CHG or antibacterial soaps. If your skin becomes reddened/irritated stop using the CHG.  1. Shower the NIGHT BEFORE SURGERY and the MORNING OF SURGERY with CHG soap.  2. If you choose to wash your hair,  wash your hair first as usual with your normal shampoo.  3. After shampooing, rinse your hair and body thoroughly to remove the shampoo.  4. Use CHG as you would any other liquid soap. You can apply CHG directly to the skin and wash gently with a scrungie or a clean washcloth.  5. Apply the CHG soap to your body only from the neck down. Do not use on open wounds or open sores. Avoid contact with your eyes, ears, mouth, and genitals (private parts). Wash face and genitals (private parts) with your normal soap.  6. Wash thoroughly, paying special attention to the area where your surgery will be performed.  7. Thoroughly rinse your body with warm water.  8. Do not shower/wash with your normal soap after using and rinsing off the CHG soap.  9. Pat yourself dry with a clean towel.  10. Wear clean pajamas to bed the night before surgery.  12. Place clean sheets on your bed the night of your first shower and do not sleep  with pets.  13. Shower again with the CHG soap on the day of surgery prior to arriving at the hospital.  14. Do not apply any deodorants/lotions/powders.  15. Please wear clean clothes to the hospital.

## 2023-08-01 ENCOUNTER — Inpatient Hospital Stay
Admission: RE | Admit: 2023-08-01 | Discharge: 2023-08-01 | Disposition: A | Discharge status: Home / Self Care | Payer: 59 | Source: Ambulatory Visit

## 2023-08-01 DIAGNOSIS — R7303 Prediabetes: Secondary | ICD-10-CM

## 2023-08-01 DIAGNOSIS — E7800 Pure hypercholesterolemia, unspecified: Secondary | ICD-10-CM

## 2023-08-01 DIAGNOSIS — I10 Essential (primary) hypertension: Secondary | ICD-10-CM

## 2023-08-01 HISTORY — DX: Radiculopathy, cervical region: M54.12

## 2023-08-01 HISTORY — DX: Carpal tunnel syndrome, right upper limb: G56.01

## 2023-08-01 HISTORY — DX: Chronic pain syndrome: G89.4

## 2023-08-01 HISTORY — DX: Prediabetes: R73.03

## 2023-08-01 NOTE — Pre-Procedure Instructions (Addendum)
Pt was called on 07-31-23 for anesthesia interview by PAT Nurse Rosey Bath with no answer and no call back. Attempted to call pt again and had to leave him a message to call us back. I also called his wife and had to leave a message with her as well. Called Tiffany (surgery scheduler) at Dr Julaine Hua office and left her a message that we cannot get in touch with pt for surgery on 11-6 and was hoping her office could reach out to pt to inform him that he needs to call us back asap as he does need to come in for ekg

## 2023-08-04 NOTE — Pre-Procedure Instructions (Signed)
Unable to reach patient for pre op interview 3rd day attempting to contact patient. EKG to be done on DOS.

## 2023-08-06 ENCOUNTER — Encounter: Admission: RE | Payer: Self-pay | Source: Home / Self Care

## 2023-08-06 ENCOUNTER — Ambulatory Visit: Admission: RE | Admit: 2023-08-06 | Payer: 59 | Source: Home / Self Care | Admitting: Surgery

## 2023-08-06 DIAGNOSIS — E7800 Pure hypercholesterolemia, unspecified: Secondary | ICD-10-CM

## 2023-08-06 DIAGNOSIS — R7303 Prediabetes: Secondary | ICD-10-CM

## 2023-08-06 DIAGNOSIS — I10 Essential (primary) hypertension: Secondary | ICD-10-CM

## 2023-08-06 SURGERY — RELEASE, CARPAL TUNNEL, ENDOSCOPIC
Anesthesia: Choice | Site: Wrist | Laterality: Right

## 2023-11-04 ENCOUNTER — Ambulatory Visit: Payer: 59 | Attending: Anesthesiology | Admitting: Anesthesiology

## 2023-11-04 ENCOUNTER — Encounter: Payer: Self-pay | Admitting: Anesthesiology

## 2023-11-04 DIAGNOSIS — M79641 Pain in right hand: Secondary | ICD-10-CM | POA: Diagnosis not present

## 2023-11-04 DIAGNOSIS — R200 Anesthesia of skin: Secondary | ICD-10-CM

## 2023-11-04 DIAGNOSIS — M5030 Other cervical disc degeneration, unspecified cervical region: Secondary | ICD-10-CM | POA: Diagnosis not present

## 2023-11-04 DIAGNOSIS — G894 Chronic pain syndrome: Secondary | ICD-10-CM

## 2023-11-04 DIAGNOSIS — G8929 Other chronic pain: Secondary | ICD-10-CM

## 2023-11-04 DIAGNOSIS — M5412 Radiculopathy, cervical region: Secondary | ICD-10-CM | POA: Diagnosis not present

## 2023-11-04 DIAGNOSIS — Z79891 Long term (current) use of opiate analgesic: Secondary | ICD-10-CM

## 2023-11-04 MED ORDER — TRAMADOL HCL 50 MG PO TABS: 100.0000 mg | ORAL_TABLET | Freq: Three times a day (TID) | ORAL | 2 refills | Status: AC

## 2023-11-04 NOTE — Patient Instructions (Signed)

## 2023-11-04 NOTE — Progress Notes (Signed)
 Virtual Visit via Telephone Note  I connected with Carlos Gentry on 11/04/23 at 11:20 AM EST by telephone and verified that I am speaking with the correct person using two identifiers.  Location: Patient: Home Provider: Pain control center   I discussed the limitations, risks, security and privacy concerns of performing an evaluation and management service by telephone and the availability of in person appointments. I also discussed with the patient that there may be a patient responsible charge related to this service. The patient expressed understanding and agreed to proceed.   History of Present Illness: I spoke with Carlos Gentry via telephone as we could not fulfill the video portion of the call.  He reports that he has been doing quite well recently.  He still taking Ultram  2 tablets 3 times a day and this is continue to work well for him.  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  The pain is generally reasonably well-controlled and at baseline he has about a pain score of 2 or 3 and with more intense activity can be a 7 or 8.  No changes in strength or sensation to the upper extremities.  He takes his tramadol  and this always helps.  He gets about 50 to 60% improvement when he is taking the medication 2 tablets 3 times a day.  Without the medication he is very limited in his ability to perform activity.  Otherwise he is in his usual state of health with no new changes in the quality characteristic or distribution of his pain.  No side effects with the medication are reported.Review of systems: General: No fevers or chills Pulmonary: No shortness of breath or dyspnea Cardiac: No angina or palpitations or lightheadedness GI: No abdominal pain or constipation Psych: No depression    Observations/Objective:  Current Outpatient Medications:    traMADol  (ULTRAM ) 50 MG tablet, Take 2 tablets (100 mg  total) by mouth 3 (three) times daily., Disp: 180 tablet, Rfl: 2   aspirin EC 81 MG tablet, Take by mouth., Disp: , Rfl:    fluticasone (FLONASE) 50 MCG/ACT nasal spray, Place into both nostrils daily. prn, Disp: , Rfl:    gabapentin  (NEURONTIN ) 100 MG capsule, Take 1 capsule (100 mg total) by mouth 2 (two) times daily., Disp: 60 capsule, Rfl: 2   ibuprofen (ADVIL) 200 MG tablet, Take by mouth., Disp: , Rfl:    lisinopril (ZESTRIL) 10 MG tablet, Take 10 mg by mouth daily., Disp: , Rfl:    omeprazole (PRILOSEC) 40 MG capsule, Take 40 mg by mouth 3 (three) times daily., Disp: , Rfl:    pravastatin (PRAVACHOL) 10 MG tablet, Take 10 mg by mouth daily. (Patient not taking: Reported on 05/12/2023), Disp: , Rfl:    PREVIDENT 5000 SENSITIVE 1.1-5 % PSTE, Take 1 application by mouth daily., Disp: , Rfl: 0   rosuvastatin (CRESTOR) 20 MG tablet, Take by mouth., Disp: , Rfl:    Past Medical History:  Diagnosis Date   Borderline diabetes    Carpal tunnel syndrome of right wrist    Cervical radiculitis    Chronic pain syndrome    Coronary artery disease    DDD (degenerative disc disease), cervical    Fatty liver    11/18   GERD (gastroesophageal reflux disease)    Hypercholesteremia    Hypertension    Left arm numbness 07/11/2020   Right hand pain 03/27/2020   Tonsillitis  4/18   Assessment and Plan:  1. Chronic pain syndrome   2. Cervical radiculitis   3. DDD (degenerative disc disease), cervical   4. Right hand pain   5. Chronic neck pain   6. Long term current use of opiate analgesic   7. Left arm numbness    Based on our conversation I think is appropriate to refill his medicines for the next 3 months.  I have reviewed the [place]  practitioner database information is appropriate.  He continues to derive good functional lifestyle improvement with the medicine no side effects are reported.  I have encouraged him to stay active with stretching strengthening exercises and avoid  overhead activity with neck strain.  Continue follow-up with his primary care physicians for baseline medical care with schedule return to clinic in 2 months. Follow Up Instructions:    I discussed the assessment and treatment plan with the patient. The patient was provided an opportunity to ask questions and all were answered. The patient agreed with the plan and demonstrated an understanding of the instructions.   The patient was advised to call back or seek an in-person evaluation if the symptoms worsen or if the condition fails to improve as anticipated.  I provided 30 minutes of non-face-to-face time during this encounter.   Lynwood KANDICE Clause, MD

## 2024-02-16 ENCOUNTER — Encounter: Payer: Self-pay | Admitting: Anesthesiology

## 2024-02-16 ENCOUNTER — Ambulatory Visit: Attending: Anesthesiology | Admitting: Anesthesiology

## 2024-02-16 DIAGNOSIS — G894 Chronic pain syndrome: Secondary | ICD-10-CM | POA: Diagnosis not present

## 2024-02-16 DIAGNOSIS — M5412 Radiculopathy, cervical region: Secondary | ICD-10-CM | POA: Diagnosis not present

## 2024-02-16 DIAGNOSIS — M79641 Pain in right hand: Secondary | ICD-10-CM | POA: Diagnosis not present

## 2024-02-16 DIAGNOSIS — I10 Essential (primary) hypertension: Secondary | ICD-10-CM

## 2024-02-16 DIAGNOSIS — M5030 Other cervical disc degeneration, unspecified cervical region: Secondary | ICD-10-CM

## 2024-02-16 DIAGNOSIS — R200 Anesthesia of skin: Secondary | ICD-10-CM

## 2024-02-16 DIAGNOSIS — G8929 Other chronic pain: Secondary | ICD-10-CM

## 2024-02-16 DIAGNOSIS — Z79891 Long term (current) use of opiate analgesic: Secondary | ICD-10-CM

## 2024-02-16 MED ORDER — TRAMADOL HCL 50 MG PO TABS: 100.0000 mg | ORAL_TABLET | Freq: Three times a day (TID) | ORAL | 2 refills | Status: DC

## 2024-02-16 NOTE — Progress Notes (Signed)
 Virtual Visit via Telephone Note  I connected with Carlos Gentry on 02/18/24 at  2:20 PM EDT by telephone and verified that I am speaking with the correct person using two identifiers.  Location: Patient: Home Provider: Pain control center   I discussed the limitations, risks, security and privacy concerns of performing an evaluation and management service by telephone and the availability of in person appointments. I also discussed with the patient that there may be a patient responsible charge related to this service. The patient expressed understanding and agreed to proceed.   History of Present Illness: Is able to reach out to Annmarie Basket via telephone for his conference.  He was unable to the video portion of the conference.  He reports that his neck pain has been stable he still has some lumbar back pain on occasion but this is work-related and generally well-controlled.  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  He takes his tramadol  2 tablets 3 times a day and this generally keeps his pain under good control.  The quality characteristic distribution of the pain has been stable with no other changes noted.  He feels like he does well with his current regimen and tries to avoid certain heavy activity with overhead lifting as reviewed.  He is doing his stretching exercises on a daily basis with occasional heat or cold application if indicated.  Review of systems: General: No fevers or chills Pulmonary: No shortness of breath or dyspnea Cardiac: No angina or palpitations or lightheadedness GI: No abdominal pain or constipation Psych: No depression   Current Outpatient Medications:    traMADol  (ULTRAM ) 50 MG tablet, Take 2 tablets (100 mg total) by mouth 3 (three) times daily., Disp: 180 tablet, Rfl: 2   aspirin EC 81 MG tablet, Take by mouth., Disp: , Rfl:    fluticasone (FLONASE) 50 MCG/ACT  nasal spray, Place into both nostrils daily. prn, Disp: , Rfl:    gabapentin  (NEURONTIN ) 100 MG capsule, Take 1 capsule (100 mg total) by mouth 2 (two) times daily., Disp: 60 capsule, Rfl: 2   ibuprofen (ADVIL) 200 MG tablet, Take by mouth., Disp: , Rfl:    lisinopril (ZESTRIL) 10 MG tablet, Take 10 mg by mouth daily., Disp: , Rfl:    omeprazole (PRILOSEC) 40 MG capsule, Take 40 mg by mouth 3 (three) times daily., Disp: , Rfl:    pravastatin (PRAVACHOL) 10 MG tablet, Take 10 mg by mouth daily. (Patient not taking: Reported on 05/12/2023), Disp: , Rfl:    PREVIDENT 5000 SENSITIVE 1.1-5 % PSTE, Take 1 application by mouth daily., Disp: , Rfl: 0   rosuvastatin (CRESTOR) 20 MG tablet, Take by mouth., Disp: , Rfl:  Observations/Objective:  Past Medical History:  Diagnosis Date   Borderline diabetes    Carpal tunnel syndrome of right wrist    Cervical radiculitis    Chronic pain syndrome    Coronary artery disease    DDD (degenerative disc disease), cervical    Fatty liver    11/18   GERD (gastroesophageal reflux disease)    Hypercholesteremia    Hypertension    Left arm numbness 07/11/2020   Right hand pain 03/27/2020   Tonsillitis    4/18    Assessment and Plan: 1. Chronic pain syndrome   2. Cervical radiculitis   3. DDD (degenerative disc disease), cervical   4. Right hand pain   5. Chronic neck pain  6. Benign essential hypertension   7. Left arm numbness   8. Long term current use of opiate analgesic    Based on our conversation and after review of the   practitioner database information I think is appropriate refills medicines for the next 3 months.  No other changes in his regimen will be initiated.  Continue restricted overhead lifting secondary to the subsequent neck pain.  Continue with his workout and core strengthening as he is doing.  Continue conservative physical therapy care and continue tramadol  as he is doing with return to clinic scheduled in 3 months.   Continue follow-up with his primary care physician for baseline medical care.  Follow Up Instructions:    I discussed the assessment and treatment plan with the patient. The patient was provided an opportunity to ask questions and all were answered. The patient agreed with the plan and demonstrated an understanding of the instructions.   The patient was advised to call back or seek an in-person evaluation if the symptoms worsen or if the condition fails to improve as anticipated.  I provided 30 minutes of non-face-to-face time during this encounter.   Zula Hitch, MD

## 2024-02-18 ENCOUNTER — Encounter: Payer: Self-pay | Admitting: Anesthesiology

## 2024-03-02 ENCOUNTER — Other Ambulatory Visit: Payer: Self-pay | Admitting: Surgery

## 2024-03-10 ENCOUNTER — Encounter
Admission: RE | Admit: 2024-03-10 | Discharge: 2024-03-10 | Disposition: A | Discharge status: Home / Self Care | Source: Ambulatory Visit | Attending: Surgery | Admitting: Surgery

## 2024-03-10 ENCOUNTER — Other Ambulatory Visit: Payer: Self-pay

## 2024-03-10 ENCOUNTER — Encounter: Payer: Self-pay | Admitting: Surgery

## 2024-03-10 VITALS — Ht 68.0 in | Wt 223.0 lb

## 2024-03-10 DIAGNOSIS — I2510 Atherosclerotic heart disease of native coronary artery without angina pectoris: Secondary | ICD-10-CM

## 2024-03-10 DIAGNOSIS — Z01810 Encounter for preprocedural cardiovascular examination: Secondary | ICD-10-CM

## 2024-03-10 DIAGNOSIS — I10 Essential (primary) hypertension: Secondary | ICD-10-CM

## 2024-03-10 HISTORY — DX: Obesity, unspecified: E66.9

## 2024-03-10 HISTORY — DX: Dizziness and giddiness: R42

## 2024-03-10 HISTORY — DX: Bradycardia, unspecified: R00.1

## 2024-03-10 NOTE — Patient Instructions (Addendum)
 Your procedure is scheduled on:03-17-24 Wednesday Report to the Registration Desk on the 1st floor of the [facility].Then proceed to the 2nd floor Surgery Desk To find out your arrival time, please call [phone] between 1PM - 3PM on:03-16-24 Tuesday If your arrival time is 6:00 am, do not arrive before that time as the [facility] entrance doors do not open until 6:00 am.  REMEMBER: Instructions that are not followed completely may result in serious medical risk, up to and including death; or upon the discretion of your surgeon and anesthesiologist your surgery may need to be rescheduled.  Do not eat food after midnight the night before surgery.  No gum chewing or hard candies.  You may however, drink CLEAR liquids up to 2 hours before you are scheduled to arrive for your surgery. Do not drink anything within 2 hours of your scheduled arrival time.  Clear liquids include: - water  - apple juice without pulp - gatorade (not RED colors) - black coffee or tea (Do NOT add milk or creamers to the coffee or tea) Do NOT drink anything that is not on this list.  In addition, your doctor has ordered for you to drink the provided:  Ensure Pre-Surgery Clear Carbohydrate Drink  Drinking this carbohydrate drink up to two hours before surgery helps to reduce insulin resistance and improve patient outcomes. Please complete drinking 2 hours before scheduled arrival time.  One week prior to surgery:Stop NOW (03-10-24) Stop Anti-inflammatories (NSAIDS) such as Advil, Aleve, Ibuprofen, Motrin, Naproxen, Naprosyn and Aspirin based products such as Excedrin, Goody's Powder, BC Powder. Stop ANY OVER THE COUNTER supplements until after surgery (Vita Fusion Multivitamin) You may continue your Metamucil Fiber Gummies  You may however, continue to take Tylenol /Tramadol  if needed for pain up until the day of surgery.  Continue taking all of your other prescription medications up until the day of  surgery.  ON THE DAY OF SURGERY ONLY TAKE THESE MEDICATIONS WITH SIPS OF WATER: -rosuvastatin (CRESTOR)   Continue your 81 mg Aspirin up until the day prior to surgery-Do NOT take the morning of surgery  No Alcohol for 24 hours before or after surgery.  No Smoking including e-cigarettes for 24 hours before surgery.  No chewable tobacco products for at least 6 hours before surgery.  No nicotine patches on the day of surgery.  Do not use any recreational drugs for at least a week (preferably 2 weeks) before your surgery.  Please be advised that the combination of cocaine and anesthesia may have negative outcomes, up to and including death. If you test positive for cocaine, your surgery will be cancelled.  On the morning of surgery brush your teeth with toothpaste and water, you may rinse your mouth with mouthwash if you wish. Do not swallow any toothpaste or mouthwash.  Use CHG Soap as directed on instruction sheet.  Do not wear jewelry, make-up, hairpins, clips or nail polish.  For welded (permanent) jewelry: bracelets, anklets, waist bands, etc.  Please have this removed prior to surgery.  If it is not removed, there is a chance that hospital personnel will need to cut it off on the day of surgery.  Do not wear lotions, powders, or perfumes.   Do not shave body hair from the neck down 48 hours before surgery.  Contact lenses, hearing aids and dentures may not be worn into surgery.  Do not bring valuables to the hospital. [facility] is not responsible for any missing/lost belongings or valuables.  Notify your doctor if there is any change in your medical condition (cold, fever, infection).  Wear comfortable clothing (specific to your surgery type) to the hospital.  After surgery, you can help prevent lung complications by doing breathing exercises.  Take deep breaths and cough every 1-2 hours. Your doctor may order a device called an Incentive Spirometer to help you take  deep breaths. When coughing or sneezing, hold a pillow firmly against your incision with both hands. This is called "splinting." Doing this helps protect your incision. It also decreases belly discomfort.  If you are being admitted to the hospital overnight, leave your suitcase in the car. After surgery it may be brought to your room.  In case of increased patient census, it may be necessary for you, the patient, to continue your postoperative care in the Same Day Surgery department.  If you are being discharged the day of surgery, you will not be allowed to drive home. You will need a responsible individual to drive you home and stay with you for 24 hours after surgery.   If you are taking public transportation, you will need to have a responsible individual with you.  Please call the Pre-admissions Testing Dept. at [phone] if you have any questions about these instructions.  Surgery Visitation Policy:  Patients having surgery or a procedure may have two visitors.  Children under the age of 85 must have an adult with them who is not the patient.     Preparing for Surgery with CHLORHEXIDINE GLUCONATE (CHG) Soap  Chlorhexidine Gluconate (CHG) Soap  o An antiseptic cleaner that kills germs and bonds with the skin to continue killing germs even after washing  o Used for showering the night before surgery and morning of surgery  Before surgery, you can play an important role by reducing the number of germs on your skin.  CHG (Chlorhexidine gluconate) soap is an antiseptic cleanser which kills germs and bonds with the skin to continue killing germs even after washing.  Please do not use if you have an allergy to CHG or antibacterial soaps. If your skin becomes reddened/irritated stop using the CHG.  1. Shower the NIGHT BEFORE SURGERY and the MORNING OF SURGERY with CHG soap.  2. If you choose to wash your hair, wash your hair first as usual with your normal shampoo.  3. After  shampooing, rinse your hair and body thoroughly to remove the shampoo.  4. Use CHG as you would any other liquid soap. You can apply CHG directly to the skin and wash gently with a scrungie or a clean washcloth.  5. Apply the CHG soap to your body only from the neck down. Do not use on open wounds or open sores. Avoid contact with your eyes, ears, mouth, and genitals (private parts). Wash face and genitals (private parts) with your normal soap.  6. Wash thoroughly, paying special attention to the area where your surgery will be performed.  7. Thoroughly rinse your body with warm water.  8. Do not shower/wash with your normal soap after using and rinsing off the CHG soap.  9. Pat yourself dry with a clean towel.  10. Wear clean pajamas to bed the night before surgery.  12. Place clean sheets on your bed the night of your first shower and do not sleep with pets.  13. Shower again with the CHG soap on the day of surgery prior to arriving at the hospital.  14. Do not apply any deodorants/lotions/powders.  15.  Please wear clean clothes to the hospital.  How to Use an Incentive Spirometer An incentive spirometer is a tool that measures how well you are filling your lungs with each breath. Learning to take long, deep breaths using this tool can help you keep your lungs clear and active. This may help to reverse or lessen your chance of developing breathing (pulmonary) problems, especially infection. You may be asked to use a spirometer: After a surgery. If you have a lung problem or a history of smoking. After a long period of time when you have been unable to move or be active. If the spirometer includes an indicator to show the highest number that you have reached, your health care provider or respiratory therapist will help you set a goal. Keep a log of your progress as told by your health care provider. What are the risks? Breathing too quickly may cause dizziness or cause you to pass  out. Take your time so you do not get dizzy or light-headed. If you are in pain, you may need to take pain medicine before doing incentive spirometry. It is harder to take a deep breath if you are having pain. How to use your incentive spirometer  Sit up on the edge of your bed or on a chair. Hold the incentive spirometer so that it is in an upright position. Before you use the spirometer, breathe out normally. Place the mouthpiece in your mouth. Make sure your lips are closed tightly around it. Breathe in slowly and as deeply as you can through your mouth, causing the piston or the ball to rise toward the top of the chamber. Hold your breath for 3-5 seconds, or for as long as possible. If the spirometer includes a coach indicator, use this to guide you in breathing. Slow down your breathing if the indicator goes above the marked areas. Remove the mouthpiece from your mouth and breathe out normally. The piston or ball will return to the bottom of the chamber. Rest for a few seconds, then repeat the steps 10 or more times. Take your time and take a few normal breaths between deep breaths so that you do not get dizzy or light-headed. Do this every 1-2 hours when you are awake. If the spirometer includes a goal marker to show the highest number you have reached (best effort), use this as a goal to work toward during each repetition. After each set of 10 deep breaths, cough a few times. This will help to make sure that your lungs are clear. If you have an incision on your chest or abdomen from surgery, place a pillow or a rolled-up towel firmly against the incision when you cough. This can help to reduce pain while taking deep breaths and coughing. General tips When you are able to get out of bed: Walk around often. Continue to take deep breaths and cough in order to clear your lungs. Keep using the incentive spirometer until your health care provider says it is okay to stop using it. If you have  been in the hospital, you may be told to keep using the spirometer at home. Contact a health care provider if: You are having difficulty using the spirometer. You have trouble using the spirometer as often as instructed. Your pain medicine is not giving enough relief for you to use the spirometer as told. You have a fever. Get help right away if: You develop shortness of breath. You develop a cough with bloody mucus from the lungs.  You have fluid or blood coming from an incision site after you cough. Summary An incentive spirometer is a tool that can help you learn to take long, deep breaths to keep your lungs clear and active. You may be asked to use a spirometer after a surgery, if you have a lung problem or a history of smoking, or if you have been inactive for a long period of time. Use your incentive spirometer as instructed every 1-2 hours while you are awake. If you have an incision on your chest or abdomen, place a pillow or a rolled-up towel firmly against your incision when you cough. This will help to reduce pain. Get help right away if you have shortness of breath, you cough up bloody mucus, or blood comes from your incision when you cough. This information is not intended to replace advice given to you by your health care provider. Make sure you discuss any questions you have with your health care provider. Document Revised: 07/25/2023 Document Reviewed: 07/25/2023 Elsevier Patient Education  2024 [employer].

## 2024-03-11 ENCOUNTER — Encounter
Admission: RE | Admit: 2024-03-11 | Discharge: 2024-03-11 | Disposition: A | Discharge status: Home / Self Care | Source: Ambulatory Visit | Attending: Surgery | Admitting: Surgery

## 2024-03-11 DIAGNOSIS — I2510 Atherosclerotic heart disease of native coronary artery without angina pectoris: Secondary | ICD-10-CM

## 2024-03-11 DIAGNOSIS — Z01810 Encounter for preprocedural cardiovascular examination: Secondary | ICD-10-CM | POA: Diagnosis not present

## 2024-03-11 DIAGNOSIS — I10 Essential (primary) hypertension: Secondary | ICD-10-CM | POA: Insufficient documentation

## 2024-03-16 ENCOUNTER — Encounter: Payer: Self-pay | Admitting: Surgery

## 2024-03-16 NOTE — Progress Notes (Signed)
 Perioperative / Anesthesia Services  Pre-Admission Testing Clinical Review / Pre-Operative Anesthesia Consult  Date: 03/16/24  PATIENT DEMOGRAPHICS: Name: Carlos Gentry DOB: [birth year] MRN:   [id number]  Note: Available PAT nursing documentation and vital signs have been reviewed. Clinical nursing staff has updated patient's PMH/PSHx, current medication list, and drug allergies/intolerances to ensure complete and comprehensive history available to assist care teams in MDM as it pertains to the aforementioned surgical procedure and anticipated anesthetic course. Extensive review of available clinical information personally performed.  PMH and PSHx updated with any diagnoses/procedures that  may have been inadvertently omitted during his intake with the pre-admission testing department's nursing staff.  PLANNED SURGICAL PROCEDURE(S):    Case: [id number] Date/Time: 03/17/24 0829   Procedure: RELEASE, CARPAL TUNNEL, ENDOSCOPIC (Right: Wrist)   Anesthesia type: Choice   Diagnosis: Carpal tunnel syndrome, right [G56.01]   Pre-op diagnosis: Carpal tunnel syndrome, right   Location: [facility] OR ROOM 02 / [facility]   Surgeons: Elner Hahn, MD     CLINICAL DISCUSSION: Carlos Gentry is a 58 y.o. male who is submitted for pre-surgical anesthesia review and clearance prior to him undergoing the above procedure. Patient has never been a smoker in the past. Pertinent PMH includes: CAD, bradycardia, nonspecific chest pain, HTN, HLD, borderline diabetes, OSAH (no nocturnal PAP therapy), GERD (no daily Tx), chronic pain syndrome, RIGHT carpal tunnel syndrome, cervical DDD.  Patient is followed by cardiology Beau Bound, MD). He was last seen in the cardiology clinic on 01/18/2024; notes reviewed. At the time of his clinic visit, patient doing well overall from a cardiovascular perspective. Patient denied any chest pain, shortness of breath, PND, orthopnea, palpitations, significant  peripheral edema, weakness, fatigue, vertiginous symptoms, or presyncope/syncope. Patient with a past medical history significant for cardiovascular diagnoses. Documented physical exam was grossly benign, providing no evidence of acute exacerbation and/or decompensation of the patient's known cardiovascular conditions.  Coronary CTA was performed on 01/02/2023 that demonstrated an Agatston coronary artery calcium score of 58.9. This placed patient in the 75th percentile for age, sex, and race matched controls. Calcium depositions noted to be isolated mainly in the LAD distribution.  Study demonstrates normal coronary origin with RIGHT dominance.  Blood pressure well controlled at 110/72 mmHg on currently prescribed ACEi (lisinopril) monotherapy.  Patient is on atorvastatin for his HLD diagnosis and ASCVD prevention.  Patient has a prediabetes diagnosis that he is managing with diet lifestyle modification.  Most recent hemoglobin A1c was 6.0% when checked on 01/28/2024.  Patient felt to have a symptom constellation consistent with OSAH.  Patient has never had formal PSG/MSLT testing.  He was referred to pulmonology. Patient is able to complete all of his  ADL/IADLs without cardiovascular limitation.  Per the [facility], patient is able to achieve at least 4 METS of physical activity without experiencing any significant degree of angina/anginal equivalent symptoms.  Given the results of his coronary CTA showing LAD calcifications, patient was sent for stress testing; pending at this time.  No changes made to his medication regimen.  Patient to follow-up with outpatient cardiology in 1 year or sooner if needed.  Carlos Gentry is scheduled for an elective RELEASE, CARPAL TUNNEL, ENDOSCOPIC (Right: Wrist) on 03/17/2024 with Dr. Delayne Feather, MD.  Given patient's past medical history significant for cardiovascular diagnoses, presurgical cardiac clearance was sought by the PAT team. Per cardiology, this patient is optimized  for surgery and may proceed with the planned procedural course with a LOW risk  of significant perioperative cardiovascular complications.  In review of the patient's chart, it is noted that he is on daily oral antithrombotic therapy. Given that patient's past medical history is significant for cardiovascular diagnoses, including but not limited to CAD, orthopedics has cleared patient to continue his daily low dose ASA throughout his perioperative course.  Patient has been updated on these directives from his specialty care providers by the PAT team.  Patient denies previous perioperative complications with anesthesia in the past. In review his EMR, there are no records available for review pertaining to any anesthetic courses within the [facility] system in the recent past.   MOST RECENT VITAL SIGNS:    03/10/2024   10:00 AM 05/12/2023    2:12 PM 05/12/2023    2:11 PM  Vitals with BMI  Height 5' 8  5' 8  Weight 223 lbs  230 lbs  BMI 33.91  34.98  Systolic  119   Diastolic  80   Pulse  75    PROVIDERS/SPECIALISTS: NOTE: Primary physician provider listed below. Patient may have been seen by APP or partner within same practice.   PROVIDER ROLE / SPECIALTY LAST OV  Poggi, Kaylene Pascal, MD Orthopedics (Surgeon) 03/12/2024  Monique Ano, MD Primary Care Provider 02/12/2024  Thais Fill, MD Cardiology 01/28/2024   ALLERGIES: No Known Allergies  CURRENT HOME MEDICATIONS: No current facility-administered medications for this encounter.    aspirin EC 81 MG tablet   ibuprofen (ADVIL) 200 MG tablet   lisinopril (ZESTRIL) 10 MG tablet   rosuvastatin (CRESTOR) 20 MG tablet   traMADol  (ULTRAM ) 50 MG tablet   METAMUCIL FIBER PO   Multiple Vitamins-Minerals (MULTIVITAMIN ADULTS PO)   HISTORY: Past Medical History:  Diagnosis Date   Borderline diabetes    Bradycardia    Carpal tunnel syndrome of right wrist    Cervical radiculitis    Chronic pain syndrome    Coronary artery  disease    DDD (degenerative disc disease), cervical    Fatty liver    GERD (gastroesophageal reflux disease)    Hypercholesteremia    Hypertension    Left arm numbness 07/11/2020   Left lower lobe pulmonary nodule 01/02/2023   a.) CTA chest 01/02/2023: 4 mm   Long-term use of aspirin therapy    Nonspecific chest pain    Obesity    OSA (obstructive sleep apnea)    a.) no nocturnal PAP therapy   Vertigo    Past Surgical History:  Procedure Laterality Date   CERVICAL SPINE SURGERY     COLONOSCOPY     ELBOW SURGERY Left    Family History  Problem Relation Age of Onset   Diabetes Mother    Heart disease Father    Stroke Father    Social History   Tobacco Use   Smoking status: Never   Smokeless tobacco: Never  Substance Use Topics   Alcohol use: No    Alcohol/week: 0.0 standard drinks of alcohol   LABS:  Lab Results  Component Value Date   WBC 3.3 (L) 06/18/2019   HGB 14.1 06/18/2019   HCT 42.8 06/18/2019   MCV 87.5 06/18/2019   PLT 234 06/18/2019   Lab Results  Component Value Date   NA 140 06/18/2019   CL 108 06/18/2019   K 4.5 06/18/2019   CO2 24 06/18/2019   BUN 14 06/18/2019   CREATININE 0.98 06/18/2019   GFRNONAA >60 06/18/2019   CALCIUM 10.1 06/18/2019   GLUCOSE 119 (H) 06/18/2019  ECG: Date: 03/11/2024  Time ECG obtained: 0815 AM Rate: 71 bpm Rhythm: normal sinus Axis (leads I and aVF): normal Intervals: PR 164 ms. QRS 84 ms. QTc 417 ms. ST segment and T wave changes: No evidence of acute T wave abnormalities or significant ST segment elevation or depression.  Evidence of a possible, age undetermined, prior infarct:  No  Comparison: Similar to previous tracing obtained on 06/18/2019   IMAGING / PROCEDURES: CT CARDIAC SCORING performed on 01/02/2023 Left lower lobe 4 mm lung nodule. If the patient is high risk consider further evaluation with a chest CT to assess for additional lesions. Otherwise no follow up is recommended. Mild bilateral  gynecomastia.  MR LUMBAR SPINE WO CONTRAST performed on 08/18/2019 Lumbar spondylosis, congenitally short pedicles, and degenerative disc disease causing prominent impingement at L4-5, and moderate impingement at L2-3, L3-4, and L5-S1.   IMPRESSION AND PLAN: Carlos Gentry has been referred for pre-anesthesia review and clearance prior to him undergoing the planned anesthetic and procedural courses. Available labs, pertinent testing, and imaging results were personally reviewed by me in preparation for upcoming operative/procedural course. [facility] medical record has been updated following extensive record review and patient interview with PAT staff.   This patient has been appropriately cleared by cardiology with an overall LOW risk of patient experiencing significant perioperative cardiovascular complications. Based on clinical review performed today (03/16/24), barring any significant acute changes in the patient's overall condition, it is anticipated that he will be able to proceed with the planned surgical intervention. Any acute changes in clinical condition may necessitate his procedure being postponed and/or cancelled. Patient will meet with anesthesia team (MD and/or CRNA) on the day of his procedure for preoperative evaluation/assessment. Questions regarding anesthetic course will be fielded at that time.   Pre-surgical instructions were reviewed with the patient during his PAT appointment, and questions were fielded to satisfaction by PAT clinical staff. He has been instructed on which medications that he will need to hold prior to surgery, as well as the ones that have been deemed safe/appropriate to take on the day of his procedure. As part of the general education provided by PAT, patient made aware both verbally and in writing, that he would need to abstain from the use of any illegal substances during his perioperative course. He was advised that failure to follow the provided instructions  could necessitate case cancellation or result in serious perioperative complications up to and including death. Patient encouraged to contact PAT and/or his surgeon's office to discuss any questions or concerns that may arise prior to surgery; verbalized understanding.   Renate Caroline, MSN, APRN, FNP-C, CEN [facility]  Perioperative Services Nurse Practitioner Phone: [phone] Fax: [phone] 03/16/24 10:59 AM  NOTE: This note has been prepared using Dragon dictation software. Despite my best ability to proofread, there is always the potential that unintentional transcriptional errors may still occur from this process.

## 2024-03-17 ENCOUNTER — Other Ambulatory Visit: Payer: Self-pay

## 2024-03-17 ENCOUNTER — Ambulatory Visit: Payer: Self-pay | Admitting: Urgent Care

## 2024-03-17 ENCOUNTER — Encounter
Admission: RE | Disposition: A | Discharge status: Home / Self Care | Payer: Self-pay | Source: Ambulatory Visit | Attending: Surgery

## 2024-03-17 ENCOUNTER — Encounter: Payer: Self-pay | Admitting: Surgery

## 2024-03-17 ENCOUNTER — Ambulatory Visit
Admission: RE | Admit: 2024-03-17 | Discharge: 2024-03-17 | Disposition: A | Discharge status: Home / Self Care | Source: Ambulatory Visit | Attending: Surgery | Admitting: Surgery

## 2024-03-17 DIAGNOSIS — I10 Essential (primary) hypertension: Secondary | ICD-10-CM | POA: Insufficient documentation

## 2024-03-17 DIAGNOSIS — G4733 Obstructive sleep apnea (adult) (pediatric): Secondary | ICD-10-CM | POA: Diagnosis not present

## 2024-03-17 DIAGNOSIS — Z79899 Other long term (current) drug therapy: Secondary | ICD-10-CM | POA: Diagnosis not present

## 2024-03-17 DIAGNOSIS — E7800 Pure hypercholesterolemia, unspecified: Secondary | ICD-10-CM | POA: Insufficient documentation

## 2024-03-17 DIAGNOSIS — K219 Gastro-esophageal reflux disease without esophagitis: Secondary | ICD-10-CM | POA: Insufficient documentation

## 2024-03-17 DIAGNOSIS — Z6833 Body mass index (BMI) 33.0-33.9, adult: Secondary | ICD-10-CM | POA: Diagnosis not present

## 2024-03-17 DIAGNOSIS — I2510 Atherosclerotic heart disease of native coronary artery without angina pectoris: Secondary | ICD-10-CM | POA: Insufficient documentation

## 2024-03-17 DIAGNOSIS — R7303 Prediabetes: Secondary | ICD-10-CM | POA: Diagnosis not present

## 2024-03-17 DIAGNOSIS — Z7982 Long term (current) use of aspirin: Secondary | ICD-10-CM | POA: Diagnosis not present

## 2024-03-17 DIAGNOSIS — Z833 Family history of diabetes mellitus: Secondary | ICD-10-CM | POA: Insufficient documentation

## 2024-03-17 DIAGNOSIS — G5601 Carpal tunnel syndrome, right upper limb: Secondary | ICD-10-CM | POA: Insufficient documentation

## 2024-03-17 DIAGNOSIS — E669 Obesity, unspecified: Secondary | ICD-10-CM | POA: Insufficient documentation

## 2024-03-17 HISTORY — PX: CARPAL TUNNEL RELEASE: SHX101

## 2024-03-17 HISTORY — DX: Obstructive sleep apnea (adult) (pediatric): G47.33

## 2024-03-17 HISTORY — DX: Chest pain, unspecified: R07.9

## 2024-03-17 HISTORY — DX: Long term (current) use of aspirin: Z79.82

## 2024-03-17 HISTORY — DX: Hypertrophy of breast: N62

## 2024-03-17 SURGERY — RELEASE, CARPAL TUNNEL, ENDOSCOPIC
Anesthesia: General | Site: Wrist | Laterality: Right

## 2024-03-17 MED ORDER — CEFAZOLIN SODIUM-DEXTROSE 2-4 GM/100ML-% IV SOLN
INTRAVENOUS | Status: AC
  Filled 2024-03-17: qty 100

## 2024-03-17 MED ORDER — KETOROLAC TROMETHAMINE 30 MG/ML IJ SOLN: 30.0000 mg | Freq: Once | INTRAMUSCULAR | Status: DC

## 2024-03-17 MED ORDER — MIDAZOLAM HCL 2 MG/2ML IJ SOLN
INTRAMUSCULAR | Status: AC
  Filled 2024-03-17: qty 2

## 2024-03-17 MED ORDER — ONDANSETRON HCL 4 MG/2ML IJ SOLN: 4.0000 mg | Freq: Four times a day (QID) | INTRAMUSCULAR | Status: DC | PRN

## 2024-03-17 MED ORDER — OXYCODONE HCL 5 MG/5ML PO SOLN: 5.0000 mg | Freq: Once | ORAL | Status: AC | PRN

## 2024-03-17 MED ORDER — OXYCODONE HCL 5 MG PO TABS
ORAL_TABLET | ORAL | Status: AC
  Filled 2024-03-17: qty 1

## 2024-03-17 MED ORDER — ONDANSETRON HCL 4 MG PO TABS
4.0000 mg | ORAL_TABLET | Freq: Four times a day (QID) | ORAL | Status: DC | PRN
Start: 2024-03-17 — End: 2024-03-17

## 2024-03-17 MED ORDER — ACETAMINOPHEN 10 MG/ML IV SOLN
INTRAVENOUS | Status: AC
  Filled 2024-03-17: qty 100

## 2024-03-17 MED ORDER — CHLORHEXIDINE GLUCONATE 0.12 % MT SOLN
OROMUCOSAL | Status: AC
Start: 2024-03-17 — End: 2024-03-17
  Filled 2024-03-17: qty 15

## 2024-03-17 MED ORDER — OXYCODONE HCL 5 MG PO TABS
5.0000 mg | ORAL_TABLET | Freq: Once | ORAL | Status: AC | PRN
  Administered 2024-03-17: 5 mg via ORAL

## 2024-03-17 MED ORDER — PROPOFOL 10 MG/ML IV BOLUS
INTRAVENOUS | Status: DC | PRN
  Administered 2024-03-17: 200 mg via INTRAVENOUS

## 2024-03-17 MED ORDER — ONDANSETRON HCL 4 MG/2ML IJ SOLN
INTRAMUSCULAR | Status: DC | PRN
  Administered 2024-03-17: 4 mg via INTRAVENOUS

## 2024-03-17 MED ORDER — CEFAZOLIN SODIUM-DEXTROSE 2-4 GM/100ML-% IV SOLN
2.0000 g | INTRAVENOUS | Status: AC
  Administered 2024-03-17: 2 g via INTRAVENOUS

## 2024-03-17 MED ORDER — LIDOCAINE HCL (PF) 2 % IJ SOLN
INTRAMUSCULAR | Status: AC
Start: 2024-03-17 — End: 2024-03-17
  Filled 2024-03-17: qty 5

## 2024-03-17 MED ORDER — ONDANSETRON HCL 4 MG/2ML IJ SOLN: 4.0000 mg | Freq: Once | INTRAMUSCULAR | Status: DC | PRN

## 2024-03-17 MED ORDER — FENTANYL CITRATE (PF) 100 MCG/2ML IJ SOLN
25.0000 ug | INTRAMUSCULAR | Status: DC | PRN
  Administered 2024-03-17: 50 ug via INTRAVENOUS

## 2024-03-17 MED ORDER — TRAMADOL HCL 50 MG PO TABS: 50.0000 mg | ORAL_TABLET | Freq: Four times a day (QID) | ORAL | Status: DC | PRN

## 2024-03-17 MED ORDER — ACETAMINOPHEN 10 MG/ML IV SOLN: 1000.0000 mg | Freq: Once | INTRAVENOUS | Status: DC | PRN

## 2024-03-17 MED ORDER — FENTANYL CITRATE (PF) 100 MCG/2ML IJ SOLN
INTRAMUSCULAR | Status: AC
Start: 2024-03-17 — End: 2024-03-17
  Filled 2024-03-17: qty 2

## 2024-03-17 MED ORDER — METOCLOPRAMIDE HCL 10 MG PO TABS: 5.0000 mg | ORAL_TABLET | Freq: Three times a day (TID) | ORAL | Status: DC | PRN

## 2024-03-17 MED ORDER — KETOROLAC TROMETHAMINE 30 MG/ML IJ SOLN
INTRAMUSCULAR | Status: DC | PRN
  Administered 2024-03-17: 30 mg via INTRAVENOUS

## 2024-03-17 MED ORDER — SODIUM CHLORIDE 0.9 % IV SOLN: INTRAVENOUS | Status: DC

## 2024-03-17 MED ORDER — LACTATED RINGERS IV SOLN: INTRAVENOUS | Status: DC

## 2024-03-17 MED ORDER — ORAL CARE MOUTH RINSE
15.0000 mL | Freq: Once | OROMUCOSAL | Status: AC
Start: 2024-03-17 — End: 2024-03-17

## 2024-03-17 MED ORDER — ONDANSETRON HCL 4 MG/2ML IJ SOLN
INTRAMUSCULAR | Status: AC
  Filled 2024-03-17: qty 2

## 2024-03-17 MED ORDER — CHLORHEXIDINE GLUCONATE 0.12 % MT SOLN
15.0000 mL | Freq: Once | OROMUCOSAL | Status: AC
Start: 2024-03-17 — End: 2024-03-17
  Administered 2024-03-17: 15 mL via OROMUCOSAL

## 2024-03-17 MED ORDER — ACETAMINOPHEN 10 MG/ML IV SOLN
INTRAVENOUS | Status: DC | PRN
  Administered 2024-03-17: 1000 mg via INTRAVENOUS

## 2024-03-17 MED ORDER — 0.9 % SODIUM CHLORIDE (POUR BTL) OPTIME
TOPICAL | Status: DC | PRN
  Administered 2024-03-17: 250 mL

## 2024-03-17 MED ORDER — KETOROLAC TROMETHAMINE 30 MG/ML IJ SOLN
INTRAMUSCULAR | Status: AC
  Filled 2024-03-17: qty 1

## 2024-03-17 MED ORDER — BUPIVACAINE HCL (PF) 0.5 % IJ SOLN
INTRAMUSCULAR | Status: AC
  Filled 2024-03-17: qty 30

## 2024-03-17 MED ORDER — PROPOFOL 10 MG/ML IV BOLUS
INTRAVENOUS | Status: AC
  Filled 2024-03-17: qty 20

## 2024-03-17 MED ORDER — METOCLOPRAMIDE HCL 5 MG/ML IJ SOLN: 5.0000 mg | Freq: Three times a day (TID) | INTRAMUSCULAR | Status: DC | PRN

## 2024-03-17 MED ORDER — ACETAMINOPHEN 325 MG PO TABS: 325.0000 mg | ORAL_TABLET | Freq: Four times a day (QID) | ORAL | Status: DC | PRN

## 2024-03-17 MED ORDER — BUPIVACAINE HCL (PF) 0.5 % IJ SOLN
INTRAMUSCULAR | Status: DC | PRN
  Administered 2024-03-17: 10 mL

## 2024-03-17 MED ORDER — LIDOCAINE HCL (CARDIAC) PF 100 MG/5ML IV SOSY
PREFILLED_SYRINGE | INTRAVENOUS | Status: DC | PRN
  Administered 2024-03-17: 100 mg via INTRAVENOUS

## 2024-03-17 SURGICAL SUPPLY — 30 items
BNDG COHESIVE 4X5 TAN STRL LF (GAUZE/BANDAGES/DRESSINGS) ×1 IMPLANT
BNDG ELASTIC 2INX 5YD STR LF (GAUZE/BANDAGES/DRESSINGS) ×1 IMPLANT
BNDG ESMARCH 4X12 STRL LF (GAUZE/BANDAGES/DRESSINGS) ×1 IMPLANT
CHLORAPREP W/TINT 26 (MISCELLANEOUS) ×1 IMPLANT
CORD BIP STRL DISP 12FT (MISCELLANEOUS) ×1 IMPLANT
CUFF TOURN SGL QUICK 18X4 (TOURNIQUET CUFF) ×1 IMPLANT
DRAPE SURG 17X11 SM STRL (DRAPES) ×1 IMPLANT
FORCEPS JEWEL BIP 4-3/4 STR (INSTRUMENTS) ×1 IMPLANT
GAUZE SPONGE 4X4 12PLY STRL (GAUZE/BANDAGES/DRESSINGS) ×1 IMPLANT
GAUZE XEROFORM 1X8 LF (GAUZE/BANDAGES/DRESSINGS) ×1 IMPLANT
GLOVE BIO SURGEON STRL SZ8 (GLOVE) ×1 IMPLANT
GLOVE INDICATOR 8.0 STRL GRN (GLOVE) ×1 IMPLANT
GOWN STRL REUS W/ TWL LRG LVL3 (GOWN DISPOSABLE) ×1 IMPLANT
GOWN STRL REUS W/ TWL XL LVL3 (GOWN DISPOSABLE) ×1 IMPLANT
KIT ESCP INSRT D SLOT CANN KN (MISCELLANEOUS) ×1 IMPLANT
KIT TURNOVER KIT A (KITS) ×1 IMPLANT
MANIFOLD NEPTUNE II (INSTRUMENTS) ×1 IMPLANT
NS IRRIG 500ML POUR BTL (IV SOLUTION) ×1 IMPLANT
PACK EXTREMITY ARMC (MISCELLANEOUS) ×1 IMPLANT
PENCIL SMOKE EVACUATOR (MISCELLANEOUS) ×1 IMPLANT
SPLINT WRIST LG LT TX990309 (SOFTGOODS) IMPLANT
SPLINT WRIST LG RT TX900304 (SOFTGOODS) IMPLANT
SPLINT WRIST M LT TX990308 (SOFTGOODS) IMPLANT
SPLINT WRIST M RT TX990303 (SOFTGOODS) IMPLANT
SPLINT WRIST XL LT TX990310 (SOFTGOODS) IMPLANT
SPLINT WRIST XL RT TX990305 (SOFTGOODS) IMPLANT
STOCKINETTE IMPERVIOUS 9X36 MD (GAUZE/BANDAGES/DRESSINGS) ×1 IMPLANT
SUT PROLENE 4 0 PS 2 18 (SUTURE) ×1 IMPLANT
TRAP FLUID SMOKE EVACUATOR (MISCELLANEOUS) IMPLANT
WATER STERILE IRR 500ML POUR (IV SOLUTION) IMPLANT

## 2024-03-17 NOTE — Discharge Instructions (Addendum)
 Orthopedic discharge instructions: Keep dressing dry and intact. Keep hand elevated above heart level. May shower after dressing removed on postop day 4 (Sunday). Cover sutures with Band-Aids after drying off, then reapply Velcro splint. Apply ice to affected area frequently. Take ibuprofen 600-800 mg TID with meals for 3-5 days, then as necessary. Take ES Tylenol  or pain medication as prescribed when needed.  Return for follow-up in 10-14 days or as scheduled.

## 2024-03-17 NOTE — Anesthesia Procedure Notes (Signed)
 Procedure Name: LMA Insertion Date/Time: 03/17/2024 8:50 AM  Performed by: Tera Fellows, CRNAPre-anesthesia Checklist: Patient identified, Emergency Drugs available, Suction available and Patient being monitored Patient Re-evaluated:Patient Re-evaluated prior to induction Oxygen Delivery Method: Circle system utilized Preoxygenation: Pre-oxygenation with 100% oxygen Induction Type: IV induction Ventilation: Mask ventilation without difficulty LMA: LMA inserted LMA Size: 4.0 Number of attempts: 1 Placement Confirmation: positive ETCO2 and breath sounds checked- equal and bilateral Tube secured with: Tape Dental Injury: Teeth and Oropharynx as per pre-operative assessment

## 2024-03-17 NOTE — Transfer of Care (Signed)
 Immediate Anesthesia Transfer of Care Note  Patient: Carlos Gentry  Procedure(s) Performed: RELEASE, CARPAL TUNNEL, ENDOSCOPIC (Right: Wrist)  Patient Location: PACU  Anesthesia Type:General  Level of Consciousness: drowsy  Airway & Oxygen Therapy: Patient Spontanous Breathing and Patient connected to face mask  Post-op Assessment: Report given to RN and Post -op Vital signs reviewed and stable  Post vital signs: Reviewed and stable  Last Vitals:  Vitals Value Taken Time  BP 111/82 03/17/24 09:25  Temp    Pulse 66 03/17/24 09:30  Resp 14 03/17/24 09:30  SpO2 100 % 03/17/24 09:30  Vitals shown include unfiled device data.  Last Pain:  Vitals:   03/17/24 0716  TempSrc: Temporal  PainSc: 5          Complications: No notable events documented.

## 2024-03-17 NOTE — Anesthesia Preprocedure Evaluation (Signed)
 Anesthesia Evaluation  Patient identified by MRN, date of birth, ID band Patient awake    Reviewed: Allergy & Precautions, NPO status , Patient's Chart, lab work & pertinent test results  History of Anesthesia Complications Negative for: history of anesthetic complications  Airway Mallampati: II  TM Distance: >3 FB Neck ROM: Full    Dental no notable dental hx. (+) Teeth Intact   Pulmonary neg sleep apnea, neg COPD, Patient abstained from smoking.Not current smoker Likely undiagnosed OSA   Pulmonary exam normal breath sounds clear to auscultation       Cardiovascular Exercise Tolerance: Good METShypertension, Pt. on medications + CAD  (-) Past MI (-) dysrhythmias  Rhythm:Regular Rate:Normal - Systolic murmurs    Neuro/Psych negative neurological ROS  negative psych ROS   GI/Hepatic ,GERD  Controlled,,(+)     (-) substance abuse    Endo/Other  neg diabetes    Renal/GU negative Renal ROS     Musculoskeletal   Abdominal   Peds  Hematology   Anesthesia Other Findings Past Medical History: No date: Borderline diabetes No date: Bradycardia No date: Carpal tunnel syndrome of right wrist No date: Cervical radiculitis No date: Chronic pain syndrome No date: Coronary artery disease No date: DDD (degenerative disc disease), cervical No date: Fatty liver No date: GERD (gastroesophageal reflux disease) No date: Gynecomastia No date: Hypercholesteremia No date: Hypertension 07/11/2020: Left arm numbness 01/02/2023: Left lower lobe pulmonary nodule     Comment:  a.) CTA chest 01/02/2023: 4 mm No date: Long-term use of aspirin therapy No date: Nonspecific chest pain No date: Obesity No date: OSA (obstructive sleep apnea)     Comment:  a.) no nocturnal PAP therapy No date: Vertigo  Reproductive/Obstetrics                             Anesthesia Physical Anesthesia Plan  ASA:  2  Anesthesia Plan: General   Post-op Pain Management: Ofirmev  IV (intra-op)* and Toradol  IV (intra-op)*   Induction: Intravenous  PONV Risk Score and Plan: 2 and Ondansetron, Dexamethasone and Midazolam  Airway Management Planned: LMA  Additional Equipment: None  Intra-op Plan:   Post-operative Plan: Extubation in OR  Informed Consent: I have reviewed the patients History and Physical, chart, labs and discussed the procedure including the risks, benefits and alternatives for the proposed anesthesia with the patient or authorized representative who has indicated his/her understanding and acceptance.     Dental advisory given  Plan Discussed with: CRNA and Surgeon  Anesthesia Plan Comments: (Discussed risks of anesthesia with patient, including PONV, sore throat, lip/dental/eye damage. Rare risks discussed as well, such as cardiorespiratory and neurological sequelae, and allergic reactions. Discussed the role of CRNA in patient's perioperative care. Patient understands.)       Anesthesia Quick Evaluation

## 2024-03-17 NOTE — H&P (Signed)
 History of Present Illness:  Carlos Gentry is a 58 y.o. male who has severe right carpal tunnel syndrome. Patient has initially been following up with Dr. Mozell Arias for his carpal tunnel issues and had a previous nerve conduction study test which showed chronic right moderate carpal tunnel and chronic left mild carpal tunnel syndrome. He has undergone multiple carpal tunnel injections with little relief of symptoms. He states that his symptoms are made worse with his daily activities at work. 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 He states that the pain keeps him up at night, states that it is very painful in the long his ring middle and pointer finger, worse along his ring finger. It greatly impacts his ability to perform his job and affects his overall quality of life. He has requested operative intervention for relief of his carpal tunnel symptoms. He denies any cardiac, pulmonary issues in the past. No previous DVTs or clots. No previous surgeries on this hand or wrist. Patient is known to be a prediabetic, his last A1c was at 6.0.  Social Hx: Patient lives at home with his wife and kids. He works a job as a Solicitor at [employer], and does work part-time doing Holiday representative type interventions. He denies any alcohol, illicit drug use, smoking or nicotine use.  Allergies: No Known Allergies  Current Outpatient Medications: aspirin 81 MG EC tablet Take 1 tablet (81 mg total) by mouth once daily  fluticasone propionate (FLONASE) 50 mcg/actuation nasal spray Place 2 sprays into both nostrils once daily 16 g 1  gabapentin  (NEURONTIN ) 100 MG capsule Take 100 mg by mouth 2 (two) times daily as needed  ibuprofen (MOTRIN) 200 MG tablet Take 200 mg by mouth every 6 (six) hours as needed for Pain 2 tabs twice a day as needed  lisinopriL (ZESTRIL) 10 MG tablet TAKE 1 TABLET BY MOUTH EVERY DAY 90 tablet 1  omeprazole (PRILOSEC) 20 MG DR capsule  Take 1 capsule (20 mg total) by mouth once daily as needed 90 capsule 1  penicillin v potassium 500 MG tablet Take 500 mg by mouth 4 (four) times daily  rosuvastatin (CRESTOR) 20 MG tablet Take 1.5 tablets (30 mg total) by mouth once daily 150 tablet 1  traMADoL  (ULTRAM ) 50 mg tablet TAKE 1 TABLET BY MOUTH EVERY 6 HOURS AS NEEDED FOR MODERATE PAIN   Past Medical History:  Obesity  Hyperlipidemia   Past Surgical History:  Repair of left elbow fracture 2000  OTHER SURGICAL HISTORY 05/2012  Cervical fusion per Dr. Selestino Dakin  COLONOSCOPY 09/20/2019 (Diverticulosis/Otherwise normal colon/Repeat 5-60yrs/[facility])    Physical Exam:  Ht: Wt: BMI: There is no height or weight on file to calculate BMI.  General/Constitutional: No apparent distress: well-nourished and well developed. Eyes: Pupils equal, round with synchronous movement. Lymphatic: No palpable adenopathy. Respiratory: Patient has good chest rise and fall with inspiration and expiration. All lung fields are clear to auscultation bilaterally. There is no Rales, rhonchi or wheezes appreciated. Cardiovascular: Upon auscultation there is a regular rate and rhythm without any murmurs, rubs, gallops or heaves appreciated. There does not appear to be any swelling down the lower extremities. Posterior tibial pulses appreciated bilaterally, 2+. Integumentary: No impressive skin lesions present, except as noted in detailed exam. Neuro/Psych: Normal mood and affect, oriented to person, place and time. Musculoskeletal: see exam below  Right hand exam: Upon inspection of the patient's right hand, there is not appear  to be any noticeable open abrasions, swelling or erythema appreciated. With palpation, patient reports having good sensation along all aspects of his fingers and palm of his hand along the dorsal and volar margins. With range of motion patient is noted to have full ROM with flexion, extension, abduction and abduction capabilities. Able to  flex and extend at his wrist with normal ROM. Tinel's test and Phalen's test is positive for eliciting numbness and tingling sensations in his fingers. Radial pulses appreciated, 2+. Cap refill less than 2 seconds in each finger.  Imaging: None ordered today  Impression: Right carpal tunnel syndrome.  Plan: The treatment options, including both surgical and nonsurgical choices, have been discussed in detail with the patient. He would like to proceed with surgical intervention to include an endoscopic right carpal tunnel release. The risks (including bleeding, infection, nerve and/or blood vessel injury, persistent or recurrent pain/paresthesias, weakness of grip, need for further surgery, blood clots, strokes, heart attacks or arrhythmias, pneumonia, etc.) and benefits of the surgical procedure were discussed. The patient states his understanding and agrees to proceed. A formal written consent will be obtained by the nursing staff.   H&P reviewed and patient re-examined. No changes.

## 2024-03-17 NOTE — Anesthesia Postprocedure Evaluation (Signed)
 Anesthesia Post Note  Patient: Carlos Gentry  Procedure(s) Performed: RELEASE, CARPAL TUNNEL, ENDOSCOPIC (Right: Wrist)  Patient location during evaluation: PACU Anesthesia Type: General Level of consciousness: awake and alert Pain management: pain level controlled Vital Signs Assessment: post-procedure vital signs reviewed and stable Respiratory status: spontaneous breathing, nonlabored ventilation, respiratory function stable and patient connected to nasal cannula oxygen Cardiovascular status: blood pressure returned to baseline and stable Postop Assessment: no apparent nausea or vomiting Anesthetic complications: no  No notable events documented.   Last Vitals:  Vitals:   03/17/24 0941 03/17/24 0945  BP:  126/83  Pulse: 84 65  Resp: 17 17  Temp:    SpO2: 100% 100%    Last Pain:  Vitals:   03/17/24 0950  TempSrc:   PainSc: 8                  Lattie Poli

## 2024-03-17 NOTE — Op Note (Signed)
 03/17/2024  9:30 AM  Patient:   Carlos Gentry  Pre-Op Diagnosis:   Right carpal tunnel syndrome.  Post-Op Diagnosis:   Same.  Procedure:   Endoscopic right carpal tunnel release.  Surgeon:   Lonnie Roberts, MD  Assistant:   [place] Lee Napolitano, PA-S  Anesthesia:   General LMA  Findings:   As above.  Complications:   None  EBL:   0 cc  Fluids:   400 cc crystalloid  TT:   12 minutes at 250 mmHg  Drains:   None  Closure:   4-0 Prolene interrupted sutures  Brief Clinical Note:   The patient is a 58 year old male with a long history of progressively worsening pain and paresthesias to his right hand. His symptoms have persisted spite medications, activity modification, injections, etc. His history and examination are consistent with carpal tunnel syndrome. The patient presents at this time for an endoscopic right carpal tunnel release.   Procedure:   The patient was brought into the operating room and lain in the supine position. After adequate general laryngeal mask anesthesia was obtained, the right hand and upper extremity were prepped with ChloraPrep solution before being draped sterilely. Preoperative antibiotics were administered. A timeout was performed to verify the appropriate surgical site before the limb was exsanguinated with an Esmarch and the tourniquet inflated to 250 mmHg.   An approximately 1.5-2 cm incision was made over the volar wrist flexion crease, centered over the palmaris longus tendon. The incision was carried down through the subcutaneous tissues with care taken to identify and protect any neurovascular structures. The distal forearm fascia was penetrated just proximal to the transverse carpal ligament. The soft tissues were released off the superficial and deep surfaces of the distal forearm fascia and this was released proximally for 3-4 cm under direct visualization.  Attention was directed distally. The Therapist, nutritional was passed beneath the transverse  carpal ligament along the ulnar aspect of the carpal tunnel and used to release any adhesions as well as to remove any adherent synovial tissue before first the smaller then the larger of the two dilators were passed beneath the transverse carpal ligament along the ulnar margin of the carpal tunnel. The slotted cannula was introduced and the endoscope was placed into the slotted cannula and the undersurface of the transverse carpal ligament visualized. The distal margin of the transverse carpal ligament was marked by placing a 25-gauge needle percutaneously at Kaplan's cardinal point so that it entered the distal portion of the slotted cannula. Under endoscopic visualization, the transverse carpal ligament was released from proximal to distal using the end-cutting blade. A second pass was performed to ensure complete release of the ligament. The adequacy of release was verified both endoscopically and by palpation using the freer elevator.  The wound was irrigated thoroughly with sterile saline solution before being closed using 4-0 Prolene interrupted sutures. A total of 10 cc of 0.5% plain Sensorcaine was injected in and around the incision before a sterile bulky dressing was applied to the wound. The patient was placed into a volar wrist splint before being awakened, extubated, and returned to the recovery room in satisfactory condition after tolerating the procedure well.

## 2024-03-18 ENCOUNTER — Encounter: Payer: Self-pay | Admitting: Surgery

## 2024-05-11 ENCOUNTER — Encounter: Admitting: Anesthesiology

## 2024-05-12 ENCOUNTER — Ambulatory Visit: Attending: Anesthesiology | Admitting: Anesthesiology

## 2024-05-12 DIAGNOSIS — M79641 Pain in right hand: Secondary | ICD-10-CM | POA: Diagnosis not present

## 2024-05-12 DIAGNOSIS — G894 Chronic pain syndrome: Secondary | ICD-10-CM

## 2024-05-12 DIAGNOSIS — M5030 Other cervical disc degeneration, unspecified cervical region: Secondary | ICD-10-CM | POA: Diagnosis not present

## 2024-05-12 DIAGNOSIS — M5412 Radiculopathy, cervical region: Secondary | ICD-10-CM | POA: Diagnosis not present

## 2024-05-12 DIAGNOSIS — R200 Anesthesia of skin: Secondary | ICD-10-CM

## 2024-05-12 DIAGNOSIS — Z79891 Long term (current) use of opiate analgesic: Secondary | ICD-10-CM

## 2024-05-12 DIAGNOSIS — G8929 Other chronic pain: Secondary | ICD-10-CM

## 2024-05-12 MED ORDER — TRAMADOL HCL 50 MG PO TABS: 100.0000 mg | ORAL_TABLET | Freq: Three times a day (TID) | ORAL | 2 refills | Status: DC

## 2024-05-12 NOTE — Progress Notes (Signed)
 Virtual Visit via Telephone Note  I connected with Carlos Gentry on 05/12/24 at  1:00 PM EDT by telephone and verified that I am speaking with the correct person using two identifiers.  Location: Patient: [facility] for in person visit Provider: [facility] center   I discussed the limitations, risks, security and privacy concerns of performing an evaluation and management service by telephone and the availability of in person appointments. I also discussed with the patient that there may be a patient responsible charge related to this service. The patient expressed understanding and agreed to proceed.   History of Present Illness: I spoke with Carlos Gentry today via telephone as we are unable link with the video portion of the conference.  He reports that he has been doing well.  No significant change in his neck pain is noted.  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  He takes tramadol  2 tablets 3 times a day and this continues to work well for him.  No side effects are noted.  Otherwise he is in his usual state of health with no new changes in upper or lower extremity strength function bowel or bladder function.  Review of systems: General: No fevers or chills Pulmonary: No shortness of breath or dyspnea Cardiac: No angina or palpitations or lightheadedness GI: No abdominal pain or constipation Psych: No depression    Observations/Objective:  Current Outpatient Medications:    acetaminophen  (TYLENOL ) 325 MG tablet, Take 650 mg by mouth every 6 (six) hours as needed., Disp: , Rfl:    aspirin EC 81 MG tablet, Take 81 mg by mouth in the morning., Disp: , Rfl:    ibuprofen (ADVIL) 200 MG tablet, Take 600 mg by mouth every 8 (eight) hours as needed (pain.)., Disp: , Rfl:    lisinopril (ZESTRIL) 10 MG tablet, Take 10 mg by mouth in the morning., Disp: , Rfl:    METAMUCIL  FIBER PO, Take 2 tablets by mouth daily. Gummies, Disp: , Rfl:    Multiple Vitamins-Minerals (MULTIVITAMIN ADULTS PO), Take 1 tablet by mouth daily at 6 (six) AM. Vitafusion, Disp: , Rfl:    rosuvastatin (CRESTOR) 20 MG tablet, Take 20 mg by mouth in the morning., Disp: , Rfl:    traMADol  (ULTRAM ) 50 MG tablet, Take 2 tablets (100 mg total) by mouth 3 (three) times daily., Disp: 180 tablet, Rfl: 2  Past Medical History:  Diagnosis Date   Borderline diabetes    Bradycardia    Carpal tunnel syndrome of right wrist    Cervical radiculitis    Chronic pain syndrome    Coronary artery disease    DDD (degenerative disc disease), cervical    Fatty liver    GERD (gastroesophageal reflux disease)    Gynecomastia    Hypercholesteremia    Hypertension    Left arm numbness 07/11/2020   Left lower lobe pulmonary nodule 01/02/2023   a.) CTA chest 01/02/2023: 4 mm   Long-term use of aspirin therapy    Nonspecific chest pain    Obesity    OSA (obstructive sleep apnea)    a.) no nocturnal PAP therapy   Vertigo      1. Chronic pain syndrome   2. Cervical radiculitis   3. DDD (degenerative disc disease), cervical   4. Right hand pain   5. Chronic neck pain   6. Left arm numbness   7.  Long term current use of opiate analgesic    Based on our conversation and after review of the [facility]  practitioner database information I think is appropriate to refill his medicines for the next 3 months.  Will keep him on tramadol  50 mg tablets to these 3 times a day and continue with core stretching strengthening exercises as reviewed today.  Continue follow-up with his primary care physician for baseline medical care with return to clinic scheduled in 3 months. Assessment and Plan:   Follow Up Instructions:    I discussed the assessment and treatment plan with the patient. The patient was provided an opportunity to ask questions and all were answered. The patient agreed with the plan and  demonstrated an understanding of the instructions.   The patient was advised to call back or seek an in-person evaluation if the symptoms worsen or if the condition fails to improve as anticipated.  I provided 30 minutes of non-face-to-face time during this encounter.   Lynwood KANDICE Clause, MD

## 2024-05-15 LAB — TOXASSURE SELECT 13 (MW), URINE

## 2024-08-04 ENCOUNTER — Telehealth: Admitting: Anesthesiology

## 2024-08-05 ENCOUNTER — Encounter: Payer: Self-pay | Admitting: Anesthesiology

## 2024-08-05 ENCOUNTER — Ambulatory Visit: Attending: Anesthesiology | Admitting: Anesthesiology

## 2024-08-05 DIAGNOSIS — M5030 Other cervical disc degeneration, unspecified cervical region: Secondary | ICD-10-CM

## 2024-08-05 DIAGNOSIS — R200 Anesthesia of skin: Secondary | ICD-10-CM

## 2024-08-05 DIAGNOSIS — G894 Chronic pain syndrome: Secondary | ICD-10-CM | POA: Diagnosis not present

## 2024-08-05 DIAGNOSIS — M5412 Radiculopathy, cervical region: Secondary | ICD-10-CM

## 2024-08-05 DIAGNOSIS — Z79891 Long term (current) use of opiate analgesic: Secondary | ICD-10-CM

## 2024-08-05 DIAGNOSIS — G8929 Other chronic pain: Secondary | ICD-10-CM

## 2024-08-05 DIAGNOSIS — M79641 Pain in right hand: Secondary | ICD-10-CM

## 2024-08-05 MED ORDER — TRAMADOL HCL 50 MG PO TABS: 100.0000 mg | ORAL_TABLET | Freq: Three times a day (TID) | ORAL | 2 refills | Status: AC

## 2024-08-05 NOTE — Progress Notes (Signed)
 Virtual Visit via Telephone Note  I connected with Carlos Gentry on 08/05/24 at  1:45 PM EST by telephone and verified that I am speaking with the correct person using two identifiers.  Location: Patient: Home Provider: [facility] center   I discussed the limitations, risks, security and privacy concerns of performing an evaluation and management service by telephone and the availability of in person appointments. I also discussed with the patient that there may be a patient responsible charge related to this service. The patient expressed understanding and agreed to proceed.   History of Present Illness: I spoke to Carlos Gentry via telephone as we could not like for the video portion of the conference.  He reports that he has been doing well.  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  He stays very active and generally can manage his low back pain and occasional neck pain with tramadol  2 tablets 3 times a day and this generally works well for him.  Otherwise he is in his usual state of health.  He denies any change in upper or lower extremity strength or function bowel or bladder function.  He denies side effects with the medications as well.  Review of systems: General: No fevers or chills Pulmonary: No shortness of breath or dyspnea Cardiac: No angina or palpitations or lightheadedness GI: No abdominal pain or constipation Psych: No depression    Observations/Objective:  Current Outpatient Medications:    acetaminophen  (TYLENOL ) 325 MG tablet, Take 650 mg by mouth every 6 (six) hours as needed., Disp: , Rfl:    aspirin EC 81 MG tablet, Take 81 mg by mouth in the morning., Disp: , Rfl:    ibuprofen (ADVIL) 200 MG tablet, Take 600 mg by mouth every 8 (eight) hours as needed (pain.)., Disp: , Rfl:    lisinopril (ZESTRIL) 10 MG tablet, Take 10 mg by mouth in the morning., Disp: , Rfl:    METAMUCIL FIBER PO, Take 2 tablets by mouth daily. Gummies, Disp: ,  Rfl:    Multiple Vitamins-Minerals (MULTIVITAMIN ADULTS PO), Take 1 tablet by mouth daily at 6 (six) AM. Vitafusion, Disp: , Rfl:    rosuvastatin (CRESTOR) 20 MG tablet, Take 20 mg by mouth in the morning., Disp: , Rfl:    [START ON 08/10/2024] traMADol  (ULTRAM ) 50 MG tablet, Take 2 tablets (100 mg total) by mouth 3 (three) times daily., Disp: 180 tablet, Rfl: 2   MRI examination of the lumbar spine Assessment and Plan: 1. Chronic pain syndrome   2. Cervical radiculitis   3. DDD (degenerative disc disease), cervical   4. Right hand pain   5. Chronic neck pain   6. Left arm numbness   7. Long term current use of opiate analgesic    *Conversation after review of the [place]  practitioner database information I think it is appropriate to refill his medicine for the next 3 months.  This to be dated for November 11.  Continue with his current medication regimen.  In addition he can use his ibuprofen at bedtime to help with inflammation and pain during the evening hours.  He is doing well with this regimen and denies any side effects and the medications enable him to stay active and functional whereas he was unable to perform some of these activities without the tramadol .  Continue core stretching strengthening exercises and continue follow-up with his primary care physicians with return to clinic scheduled in 3 months.  Follow Up  Instructions:    I discussed the assessment and treatment plan with the patient. The patient was provided an opportunity to ask questions and all were answered. The patient agreed with the plan and demonstrated an understanding of the instructions.   The patient was advised to call back or seek an in-person evaluation if the symptoms worsen or if the condition fails to improve as anticipated.  I provided 30 minutes of non-face-to-face time during this encounter.   Lynwood KANDICE Clause, MD
